# Patient Record
Sex: Male | Born: 1955 | Race: White | Hispanic: No | Marital: Married | Smoking: Never smoker
Health system: Southern US, Community
[De-identification: ages and names within clinical notes are randomized; demographics above are authoritative.]

## PROBLEM LIST (undated history)

## (undated) DIAGNOSIS — T79A22A Traumatic compartment syndrome of left lower extremity, initial encounter: Secondary | ICD-10-CM

## (undated) DIAGNOSIS — S82202B Unspecified fracture of shaft of left tibia, initial encounter for open fracture type I or II: Secondary | ICD-10-CM

## (undated) DIAGNOSIS — S82402B Unspecified fracture of shaft of left fibula, initial encounter for open fracture type I or II: Secondary | ICD-10-CM

---

## 2018-06-08 ENCOUNTER — Other Ambulatory Visit: Payer: Self-pay

## 2018-06-08 ENCOUNTER — Encounter (HOSPITAL_COMMUNITY): Payer: Self-pay | Admitting: Anesthesiology

## 2018-06-08 ENCOUNTER — Encounter (HOSPITAL_COMMUNITY)
Admission: EM | Disposition: A | Discharge status: Home / Self Care | Payer: Self-pay | Source: Home / Self Care | Attending: Orthopedic Surgery

## 2018-06-08 ENCOUNTER — Emergency Department (HOSPITAL_COMMUNITY): Payer: Self-pay

## 2018-06-08 ENCOUNTER — Emergency Department (HOSPITAL_COMMUNITY): Payer: Self-pay | Admitting: Anesthesiology

## 2018-06-08 ENCOUNTER — Inpatient Hospital Stay (HOSPITAL_COMMUNITY)
Admission: EM | Admit: 2018-06-08 | Discharge: 2018-06-13 | DRG: 493 | Disposition: A | Discharge status: Home / Self Care | Payer: Self-pay | Attending: Orthopedic Surgery | Admitting: Orthopedic Surgery

## 2018-06-08 DIAGNOSIS — Y9339 Activity, other involving climbing, rappelling and jumping off: Secondary | ICD-10-CM

## 2018-06-08 DIAGNOSIS — H7190 Unspecified cholesteatoma, unspecified ear: Secondary | ICD-10-CM | POA: Diagnosis present

## 2018-06-08 DIAGNOSIS — S82222B Displaced transverse fracture of shaft of left tibia, initial encounter for open fracture type I or II: Secondary | ICD-10-CM | POA: Diagnosis present

## 2018-06-08 DIAGNOSIS — Z419 Encounter for procedure for purposes other than remedying health state, unspecified: Secondary | ICD-10-CM

## 2018-06-08 DIAGNOSIS — Y93I9 Activity, other involving external motion: Secondary | ICD-10-CM

## 2018-06-08 DIAGNOSIS — W3089XA Contact with other specified agricultural machinery, initial encounter: Secondary | ICD-10-CM

## 2018-06-08 DIAGNOSIS — Y9279 Other farm location as the place of occurrence of the external cause: Secondary | ICD-10-CM

## 2018-06-08 DIAGNOSIS — S82402B Unspecified fracture of shaft of left fibula, initial encounter for open fracture type I or II: Secondary | ICD-10-CM

## 2018-06-08 DIAGNOSIS — I10 Essential (primary) hypertension: Secondary | ICD-10-CM | POA: Diagnosis present

## 2018-06-08 DIAGNOSIS — S82422A Displaced transverse fracture of shaft of left fibula, initial encounter for closed fracture: Secondary | ICD-10-CM

## 2018-06-08 DIAGNOSIS — S82222A Displaced transverse fracture of shaft of left tibia, initial encounter for closed fracture: Secondary | ICD-10-CM

## 2018-06-08 DIAGNOSIS — S82202B Unspecified fracture of shaft of left tibia, initial encounter for open fracture type I or II: Secondary | ICD-10-CM

## 2018-06-08 DIAGNOSIS — T79A22A Traumatic compartment syndrome of left lower extremity, initial encounter: Secondary | ICD-10-CM

## 2018-06-08 DIAGNOSIS — S82422B Displaced transverse fracture of shaft of left fibula, initial encounter for open fracture type I or II: Principal | ICD-10-CM | POA: Diagnosis present

## 2018-06-08 HISTORY — PX: I & D EXTREMITY: SHX5045

## 2018-06-08 HISTORY — PX: TIBIA IM NAIL INSERTION: SHX2516

## 2018-06-08 HISTORY — DX: Unspecified fracture of shaft of left tibia, initial encounter for open fracture type I or II: S82.202B

## 2018-06-08 HISTORY — DX: Unspecified fracture of shaft of left fibula, initial encounter for open fracture type I or II: S82.402B

## 2018-06-08 HISTORY — PX: FASCIOTOMY: SHX132

## 2018-06-08 HISTORY — DX: Traumatic compartment syndrome of left lower extremity, initial encounter: T79.A22A

## 2018-06-08 LAB — I-STAT CHEM 8, ED
BUN: 17 mg/dL (ref 6–20)
Calcium, Ion: 0.95 mmol/L — ABNORMAL LOW (ref 1.15–1.40)
Chloride: 108 mmol/L (ref 98–111)
Creatinine, Ser: 1.1 mg/dL (ref 0.61–1.24)
Glucose, Bld: 112 mg/dL — ABNORMAL HIGH (ref 70–99)
HCT: 38 % — ABNORMAL LOW (ref 39.0–52.0)
Hemoglobin: 12.9 g/dL — ABNORMAL LOW (ref 13.0–17.0)
POTASSIUM: 4 mmol/L (ref 3.5–5.1)
SODIUM: 140 mmol/L (ref 135–145)
TCO2: 21 mmol/L — ABNORMAL LOW (ref 22–32)

## 2018-06-08 LAB — CBC WITH DIFFERENTIAL/PLATELET
Abs Immature Granulocytes: 0.12 10*3/uL — ABNORMAL HIGH (ref 0.00–0.07)
BASOS ABS: 0.1 10*3/uL (ref 0.0–0.1)
Basophils Relative: 1 %
EOS ABS: 0.1 10*3/uL (ref 0.0–0.5)
EOS PCT: 1 %
HEMATOCRIT: 39.5 % (ref 39.0–52.0)
HEMOGLOBIN: 13.3 g/dL (ref 13.0–17.0)
Immature Granulocytes: 1 %
Lymphocytes Relative: 13 %
Lymphs Abs: 1.9 10*3/uL (ref 0.7–4.0)
MCH: 30.4 pg (ref 26.0–34.0)
MCHC: 33.7 g/dL (ref 30.0–36.0)
MCV: 90.4 fL (ref 80.0–100.0)
Monocytes Absolute: 0.8 10*3/uL (ref 0.1–1.0)
Monocytes Relative: 5 %
NRBC: 0 % (ref 0.0–0.2)
Neutro Abs: 12.4 10*3/uL — ABNORMAL HIGH (ref 1.7–7.7)
Neutrophils Relative %: 79 %
Platelets: 290 10*3/uL (ref 150–400)
RBC: 4.37 MIL/uL (ref 4.22–5.81)
RDW: 11.9 % (ref 11.5–15.5)
WBC: 15.4 10*3/uL — AB (ref 4.0–10.5)

## 2018-06-08 SURGERY — INSERTION, INTRAMEDULLARY ROD, TIBIA
Anesthesia: General | Site: Leg Lower | Laterality: Left

## 2018-06-08 MED ORDER — FENTANYL CITRATE (PF) 100 MCG/2ML IJ SOLN
100.0000 ug | Freq: Once | INTRAMUSCULAR | Status: AC
  Administered 2018-06-08: 100 ug via INTRAVENOUS

## 2018-06-08 MED ORDER — SUCCINYLCHOLINE CHLORIDE 20 MG/ML IJ SOLN
INTRAMUSCULAR | Status: DC | PRN
  Administered 2018-06-08: 140 mg via INTRAVENOUS

## 2018-06-08 MED ORDER — FENTANYL CITRATE (PF) 100 MCG/2ML IJ SOLN
INTRAMUSCULAR | Status: DC | PRN
  Administered 2018-06-08: 75 ug via INTRAVENOUS
  Administered 2018-06-08: 25 ug via INTRAVENOUS

## 2018-06-08 MED ORDER — PROPOFOL 10 MG/ML IV BOLUS
INTRAVENOUS | Status: DC | PRN
  Administered 2018-06-08: 150 mg via INTRAVENOUS

## 2018-06-08 MED ORDER — ASPIRIN EC 325 MG PO TBEC: 325.0000 mg | DELAYED_RELEASE_TABLET | Freq: Two times a day (BID) | ORAL | 0 refills | Status: AC

## 2018-06-08 MED ORDER — HYDROMORPHONE HCL 1 MG/ML IJ SOLN
1.0000 mg | Freq: Once | INTRAMUSCULAR | Status: AC
  Administered 2018-06-08: 1 mg via INTRAVENOUS

## 2018-06-08 MED ORDER — EPHEDRINE SULFATE 50 MG/ML IJ SOLN
INTRAMUSCULAR | Status: DC | PRN
  Administered 2018-06-08: 5 mg via INTRAVENOUS

## 2018-06-08 MED ORDER — HYDROMORPHONE HCL 1 MG/ML IJ SOLN
0.2500 mg | INTRAMUSCULAR | Status: DC | PRN
  Administered 2018-06-09: 0.5 mg via INTRAVENOUS

## 2018-06-08 MED ORDER — MIDAZOLAM HCL 5 MG/5ML IJ SOLN
INTRAMUSCULAR | Status: DC | PRN
  Administered 2018-06-08 (×2): 1 mg via INTRAVENOUS

## 2018-06-08 MED ORDER — LIDOCAINE 2% (20 MG/ML) 5 ML SYRINGE
INTRAMUSCULAR | Status: DC | PRN
  Administered 2018-06-08: 50 mg via INTRAVENOUS

## 2018-06-08 MED ORDER — HYDROMORPHONE HCL 1 MG/ML IJ SOLN
1.0000 mg | INTRAMUSCULAR | Status: DC | PRN
  Filled 2018-06-08 (×2): qty 1

## 2018-06-08 MED ORDER — SODIUM CHLORIDE 0.9 % IV SOLN
INTRAVENOUS | Status: DC | PRN
  Administered 2018-06-08: 100 ug/min via INTRAVENOUS

## 2018-06-08 MED ORDER — FENTANYL CITRATE (PF) 100 MCG/2ML IJ SOLN
INTRAMUSCULAR | Status: AC
  Filled 2018-06-08: qty 2

## 2018-06-08 MED ORDER — LIDOCAINE 2% (20 MG/ML) 5 ML SYRINGE
INTRAMUSCULAR | Status: AC
  Filled 2018-06-08: qty 5

## 2018-06-08 MED ORDER — SUCCINYLCHOLINE CHLORIDE 200 MG/10ML IV SOSY
PREFILLED_SYRINGE | INTRAVENOUS | Status: AC
  Filled 2018-06-08: qty 10

## 2018-06-08 MED ORDER — CEFAZOLIN SODIUM-DEXTROSE 1-4 GM/50ML-% IV SOLN
INTRAVENOUS | Status: DC | PRN
  Administered 2018-06-08: 2 g via INTRAVENOUS

## 2018-06-08 MED ORDER — DEXAMETHASONE SODIUM PHOSPHATE 10 MG/ML IJ SOLN
INTRAMUSCULAR | Status: DC | PRN
  Administered 2018-06-08: 5 mg via INTRAVENOUS

## 2018-06-08 MED ORDER — FENTANYL CITRATE (PF) 250 MCG/5ML IJ SOLN
INTRAMUSCULAR | Status: AC
  Filled 2018-06-08: qty 5

## 2018-06-08 MED ORDER — PROMETHAZINE HCL 25 MG/ML IJ SOLN: 6.2500 mg | INTRAMUSCULAR | Status: DC | PRN

## 2018-06-08 MED ORDER — ROCURONIUM BROMIDE 50 MG/5ML IV SOSY
PREFILLED_SYRINGE | INTRAVENOUS | Status: AC
  Filled 2018-06-08: qty 5

## 2018-06-08 MED ORDER — FENTANYL CITRATE (PF) 100 MCG/2ML IJ SOLN
100.0000 ug | Freq: Once | INTRAMUSCULAR | Status: AC
  Administered 2018-06-08: 100 ug via INTRAVENOUS
  Filled 2018-06-08: qty 2

## 2018-06-08 MED ORDER — CEPHALEXIN 500 MG PO CAPS: 500.0000 mg | ORAL_CAPSULE | Freq: Four times a day (QID) | ORAL | 0 refills | Status: AC

## 2018-06-08 MED ORDER — MIDAZOLAM HCL 2 MG/2ML IJ SOLN
INTRAMUSCULAR | Status: AC
  Filled 2018-06-08: qty 2

## 2018-06-08 MED ORDER — DEXAMETHASONE SODIUM PHOSPHATE 10 MG/ML IJ SOLN
INTRAMUSCULAR | Status: AC
  Filled 2018-06-08: qty 1

## 2018-06-08 MED ORDER — PHENYLEPHRINE 40 MCG/ML (10ML) SYRINGE FOR IV PUSH (FOR BLOOD PRESSURE SUPPORT)
PREFILLED_SYRINGE | INTRAVENOUS | Status: DC | PRN
  Administered 2018-06-08 (×2): 80 ug via INTRAVENOUS

## 2018-06-08 MED ORDER — OXYCODONE HCL 5 MG PO TABS: 5.0000 mg | ORAL_TABLET | ORAL | 0 refills | Status: AC | PRN

## 2018-06-08 MED ORDER — PHENYLEPHRINE 40 MCG/ML (10ML) SYRINGE FOR IV PUSH (FOR BLOOD PRESSURE SUPPORT)
PREFILLED_SYRINGE | INTRAVENOUS | Status: AC
  Filled 2018-06-08: qty 10

## 2018-06-08 MED ORDER — HYDROMORPHONE HCL 1 MG/ML IJ SOLN
0.5000 mg | Freq: Once | INTRAMUSCULAR | Status: DC
  Filled 2018-06-08: qty 1

## 2018-06-08 MED ORDER — CEFAZOLIN SODIUM-DEXTROSE 1-4 GM/50ML-% IV SOLN: 1.0000 g | Freq: Once | INTRAVENOUS | Status: DC

## 2018-06-08 MED ORDER — ONDANSETRON HCL 4 MG PO TABS: 4.0000 mg | ORAL_TABLET | Freq: Three times a day (TID) | ORAL | 0 refills | Status: AC | PRN

## 2018-06-08 MED ORDER — LACTATED RINGERS IV SOLN
INTRAVENOUS | Status: DC | PRN
  Administered 2018-06-08 (×2): via INTRAVENOUS

## 2018-06-08 MED ORDER — SODIUM CHLORIDE 0.9 % IR SOLN
Status: DC | PRN
  Administered 2018-06-08: 3000 mL
  Administered 2018-06-08: 6000 mL

## 2018-06-08 MED ORDER — SENNA-DOCUSATE SODIUM 8.6-50 MG PO TABS: 2.0000 | ORAL_TABLET | Freq: Every day | ORAL | 1 refills | Status: AC

## 2018-06-08 MED ORDER — ROCURONIUM BROMIDE 50 MG/5ML IV SOSY
PREFILLED_SYRINGE | INTRAVENOUS | Status: DC | PRN
  Administered 2018-06-08: 30 mg via INTRAVENOUS

## 2018-06-08 MED ORDER — MEPERIDINE HCL 50 MG/ML IJ SOLN: 6.2500 mg | INTRAMUSCULAR | Status: DC | PRN

## 2018-06-08 MED ORDER — ONDANSETRON HCL 4 MG/2ML IJ SOLN
INTRAMUSCULAR | Status: DC | PRN
  Administered 2018-06-08: 4 mg via INTRAVENOUS

## 2018-06-08 MED ORDER — 0.9 % SODIUM CHLORIDE (POUR BTL) OPTIME
TOPICAL | Status: DC | PRN
  Administered 2018-06-08: 1000 mL

## 2018-06-08 MED ORDER — ALBUMIN HUMAN 5 % IV SOLN
INTRAVENOUS | Status: DC | PRN
  Administered 2018-06-08: 23:00:00 via INTRAVENOUS

## 2018-06-08 MED ORDER — LACTATED RINGERS IV SOLN: INTRAVENOUS | Status: DC

## 2018-06-08 MED ORDER — ONDANSETRON HCL 4 MG/2ML IJ SOLN
INTRAMUSCULAR | Status: AC
  Filled 2018-06-08: qty 2

## 2018-06-08 SURGICAL SUPPLY — 60 items
BANDAGE ESMARK 6X9 LF (GAUZE/BANDAGES/DRESSINGS) ×2 IMPLANT
BIT DRILL 3.8X6 NS (BIT) ×4 IMPLANT
BIT DRILL 4.4 NS (BIT) ×4 IMPLANT
BLADE CLIPPER SURG (BLADE) ×4 IMPLANT
BNDG COHESIVE 6X5 TAN STRL LF (GAUZE/BANDAGES/DRESSINGS) ×4 IMPLANT
BNDG ELASTIC 6X10 VLCR STRL LF (GAUZE/BANDAGES/DRESSINGS) ×4 IMPLANT
BNDG ESMARK 6X9 LF (GAUZE/BANDAGES/DRESSINGS) ×4
BOOTCOVER CLEANROOM LRG (PROTECTIVE WEAR) ×8 IMPLANT
CANISTER WOUND CARE 500ML ATS (WOUND CARE) ×4 IMPLANT
CLOSURE WOUND 1/2 X4 (GAUZE/BANDAGES/DRESSINGS) ×1
CONNECTOR Y ATS VAC SYSTEM (MISCELLANEOUS) ×4 IMPLANT
COVER SURGICAL LIGHT HANDLE (MISCELLANEOUS) ×4 IMPLANT
CUFF TOURNIQUET SINGLE 34IN LL (TOURNIQUET CUFF) ×4 IMPLANT
DRAPE C-ARM 42X72 X-RAY (DRAPES) ×4 IMPLANT
DRAPE IMP U-DRAPE 54X76 (DRAPES) ×4 IMPLANT
DRAPE U-SHAPE 47X51 STRL (DRAPES) ×4 IMPLANT
DRSG MEPILEX BORDER 4X4 (GAUZE/BANDAGES/DRESSINGS) ×4 IMPLANT
DRSG VAC ATS LRG SENSATRAC (GAUZE/BANDAGES/DRESSINGS) ×4 IMPLANT
ELECT CAUTERY BLADE 6.4 (BLADE) ×4 IMPLANT
ELECT REM PT RETURN 9FT ADLT (ELECTROSURGICAL) ×4
ELECTRODE REM PT RTRN 9FT ADLT (ELECTROSURGICAL) ×2 IMPLANT
GLOVE BIOGEL PI IND STRL 7.0 (GLOVE) ×2 IMPLANT
GLOVE BIOGEL PI INDICATOR 7.0 (GLOVE) ×2
GLOVE BIOGEL PI ORTHO PRO SZ8 (GLOVE) ×2
GLOVE ORTHO TXT STRL SZ7.5 (GLOVE) ×12 IMPLANT
GLOVE PI ORTHO PRO STRL SZ8 (GLOVE) ×2 IMPLANT
GLOVE SURG ORTHO 8.0 STRL STRW (GLOVE) ×8 IMPLANT
GLOVE SURG SS PI 7.0 STRL IVOR (GLOVE) ×8 IMPLANT
GOWN STRL REUS W/ TWL LRG LVL3 (GOWN DISPOSABLE) ×6 IMPLANT
GOWN STRL REUS W/TWL LRG LVL3 (GOWN DISPOSABLE) ×6
GUIDEPIN 3.2X17.5 THRD DISP (PIN) ×4 IMPLANT
GUIDEWIRE BALL NOSE 80CM (WIRE) ×4 IMPLANT
KIT BASIN OR (CUSTOM PROCEDURE TRAY) ×4 IMPLANT
KIT TURNOVER KIT B (KITS) ×4 IMPLANT
MANIFOLD NEPTUNE II (INSTRUMENTS) ×4 IMPLANT
NAIL TIBIAL 9MMX34.5CM (Nail) ×4 IMPLANT
NS IRRIG 1000ML POUR BTL (IV SOLUTION) ×4 IMPLANT
PACK GENERAL/GYN (CUSTOM PROCEDURE TRAY) ×4 IMPLANT
PACK UNIVERSAL I (CUSTOM PROCEDURE TRAY) ×4 IMPLANT
PAD ARMBOARD 7.5X6 YLW CONV (MISCELLANEOUS) ×8 IMPLANT
PAD NEG PRESSURE SENSATRAC (MISCELLANEOUS) ×4 IMPLANT
SCREW ACECAP 36MM (Screw) ×4 IMPLANT
SCREW ACECAP 50MM (Screw) ×4 IMPLANT
SCREW PROXIMAL DEPUY (Screw) ×4 IMPLANT
SCREW PRXML FT 55X5.5XNS TIB (Screw) ×2 IMPLANT
SCREW PRXML FT 65X5.5XNS CORT (Screw) ×2 IMPLANT
STRIP CLOSURE SKIN 1/2X4 (GAUZE/BANDAGES/DRESSINGS) ×3 IMPLANT
SUT ETHILON 3 0 PS 1 (SUTURE) ×8 IMPLANT
SUT VIC AB 0 CT1 18XCR BRD 8 (SUTURE) IMPLANT
SUT VIC AB 0 CT1 27 (SUTURE) ×2
SUT VIC AB 0 CT1 27XBRD ANBCTR (SUTURE) ×2 IMPLANT
SUT VIC AB 0 CT1 8-18 (SUTURE)
SUT VIC AB 2-0 CT1 27 (SUTURE)
SUT VIC AB 2-0 CT1 TAPERPNT 27 (SUTURE) IMPLANT
SUT VIC AB 3-0 SH 8-18 (SUTURE) ×4 IMPLANT
TOWEL GREEN STERILE (TOWEL DISPOSABLE) ×4 IMPLANT
TRAY FOLEY MTR SLVR 16FR STAT (SET/KITS/TRAYS/PACK) ×4 IMPLANT
TUBE CONNECTING 12'X1/4 (SUCTIONS) ×1
TUBE CONNECTING 12X1/4 (SUCTIONS) ×3 IMPLANT
YANKAUER SUCT BULB TIP NO VENT (SUCTIONS) ×4 IMPLANT

## 2018-06-08 NOTE — Discharge Instructions (Signed)
Diet: As you were doing prior to hospitalization   Shower:  May shower but keep the wounds dry, use an occlusive plastic wrap, NO SOAKING IN TUB.  If the bandage gets wet, change with a clean dry gauze.  If you have a splint on, leave the splint in place and keep the splint dry with a plastic bag.  Dressing:  You may change your dressing 3-5 days after surgery, unless you have a splint.  If you have a splint, then just leave the splint in place and we will change your bandages during your first follow-up appointment.    If you had hand or foot surgery, we will plan to remove your stitches in about 2 weeks in the office.  For all other surgeries, there are sticky tapes (steri-strips) on your wounds and all the stitches are absorbable.  Leave the steri-strips in place when changing your dressings, they will peel off with time, usually 2-3 weeks.  Activity:  Increase activity slowly as tolerated, but follow the weight bearing instructions below.  The rules on driving is that you can not be taking narcotics while you drive, and you must feel in control of the vehicle.    Weight Bearing:   Non weight bearing left leg.    To prevent constipation: you may use a stool softener such as -  Colace (over the counter) 100 mg by mouth twice a day  Drink plenty of fluids (prune juice may be helpful) and high fiber foods Miralax (over the counter) for constipation as needed.    Itching:  If you experience itching with your medications, try taking only a single pain pill, or even half a pain pill at a time.  You may take up to 10 pain pills per day, and you can also use benadryl over the counter for itching or also to help with sleep.   Precautions:  If you experience chest pain or shortness of breath - call 911 immediately for transfer to the hospital emergency department!!  If you develop a fever greater that 101 F, purulent drainage from wound, increased redness or drainage from wound, or calf pain -- Call  the office at [phone]                                                Follow- Up Appointment:  Please call for an appointment to be seen in 2 weeks [place] - [phone]     

## 2018-06-08 NOTE — ED Notes (Signed)
Given Ancef 1 g en route to hospital

## 2018-06-08 NOTE — Op Note (Signed)
06/08/2018  10:20 PM  PATIENT:  Ross Carr    PRE-OPERATIVE DIAGNOSIS: Open left tibia and fibula fracture, grade 1 with compartment syndrome  POST-OPERATIVE DIAGNOSIS:  Same  PROCEDURE:    1.  Left tibia and fibula irrigation, debridement, skin, subcutaneous tissue, bone, using scissors, rongure, curette 2.  Left tibia intramedullary nail fixation 3.  Left leg 4 compartment fasciotomy 4.  Application of large wound VAC, to both medial and lateral sides of the leg  SURGEON:  Eulas Post, MD  PHYSICIAN ASSISTANT: Janace Litten, OPA-C, present and scrubbed throughout the case, critical for completion in a timely fashion, and for retraction, instrumentation, and closure.  ANESTHESIA:   General  PREOPERATIVE INDICATIONS:  Ross Carr is a  62 y.o. male who was crushed by a tractor and had an acute open left tibia and fibula fracture with inconsolable pain consistent with compartment syndrome who elected for surgical management.    The risks benefits and alternatives were discussed with the patient including but not limited to the risks of nonoperative treatment, versus surgical intervention including infection, bleeding, nerve injury, malunion, nonunion, the need for revision surgery, hardware prominence, hardware failure, the need for hardware removal, blood clots, cardiopulmonary complications, morbidity, mortality, among others, and they were willing to proceed.    ESTIMATED BLOOD LOSS: 250 mL  OPERATIVE IMPLANTS: Biomet tibial nail size 9 x 345 with a total of 1 proximal interlocking bolts, and 2 distal interlocking bolts.  OPERATIVE FINDINGS: His bone quality proximally was actually not nearly as good as I would have predicted.  He also had a patella [place] which made it somewhat difficult to get appropriate nail placement.  There was substantial comminution at the actual fracture site with a butterfly segment which keyed in nicely.  The posterior compartment was soft,  although did bulge out upon release, the deep posterior compartment had a fair amount of hemorrhage, and also released fairly significantly.  The really dramatic compartment was the anterior compartment, the lateral compartment was not as tight.  On the anterior compartment some of the anterior musculature was very limited in its contractility with Bovie stimulation, and there was significant hemorrhage.  OPERATIVE PROCEDURE: The patient was brought to the operating room and placed in the supine position. Gen. anesthesia was administered.  Foley was placed.  The lower extremity was prepped and draped in usual sterile fashion. Time out was performed.  I began with medial fasciotomy which gave me ample access for irrigation and debridement, the bone ends were exposed, the traumatic laceration was opened enough to deliver the bone ends completely, the wounds were debrided with a scissors as well as a rongure, and a total of 9 L of fluid was irrigated through both ends of the fracture.  I took care to try and minimize risk to the saphenous nerve, although I did not encounter it.  I then changed instruments, gloves, drapes, and proceeded with the lateral fasciotomy.  A 15 cm fasciotomy was made longitudinally halfway between the fibula and the tibia.  The perforating vessel through the fascia was identified, care was taken to protect the superficial peroneal nerve, and the fascia was released anteriorly and posteriorly both proximally and distally.  The anterior compartment bulged significantly, and was hemorrhagic, and had minimal contractility.  I then proceeded with the intramedullary nail fixation of the case.  Anterior patellar tendon incision was performed and the proximal tibia exposed. The knee was hyperflexed, and a guidewire introduced into the appropriate position and confirmed  on fluoroscopy on both AP and lateral views.  I opened the proximal tibia with the appropriate reamer, and then placed a  ball-tipped guidewire down across the fracture site reducing it anatomically.  I confirmed position on AP and lateral views across the entire length of the tibia, and then reamed sequentially to 1.5 mm above the nail size.  He had substantial chatter even at the size 9 nail.  The nail was measured in length, selected, opened, assembled, and then delivered down the tibia across the fracture site. Appropriate alignment confirmed on AP and lateral views. The length was also confirmed.  I could palpate the fracture directly and had anatomic rotational alignment, and the butterfly segment posterior medially keyed in perfectly.  I then secured the nail with proximal interlocking bolts, and also used perfect circles technique to secure the nail distally with interlocking bolts. I took care to confirm fracture apposition as well as rotational alignment clinically and radiographically prior to securing the distal segment.  Final C-arm pictures were taken, the wounds were irrigated copiously, there were also injected with Marcaine, and the patellar tendon split repaired with Vicryl followed by Vicryl and Monocryl for the subcutaneous tissues.   A wound VAC was applied both medially and laterally, and had excellent seal.  The patient was awakened and returned to the PACU in stable and satisfactory condition. There were no complications.  He will need to return to the operating room for repeat irrigation and debridement, and possible closure.  We did place retention sutures to try and minimize the creep of the tissue.  I also did perform a slight manipulation of the fibula to try and get better apposition of the bone, although I did not expose the fibula itself.

## 2018-06-08 NOTE — H&P (Addendum)
PREOPERATIVE H&P  Chief Complaint: Left leg pain  HPI: Ross Carr is a 62 y.o. male who was crushed by an 800 pound tractor today, which tipped over onto his left leg.  Acute severe pain, difficulty with moving it.  Pain is been getting excruciatingly worse, uncontrolled with IV pain medications.  He is noted significant swelling as well as persistent bleeding.  Denies any other injuries.  He has a complicated past history, in fact his wife had a tibial nail done before, his right upper extremity also had what sounds like a compartment syndrome with fasciotomies, he is fairly familiar with this whole process.  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  No past medical history on file.  Social History   Socioeconomic History  . Marital status: Married    Spouse name: Not on file  . Number of children: Not on file  . Years of education: Not on file  . Highest education level: Not on file  Occupational History  . Not on file  Social Needs  . Financial resource strain: Not on file  . Food insecurity:    Worry: Not on file    Inability: Not on file  . Transportation needs:    Medical: Not on file    Non-medical: Not on file  Tobacco Use  . Smoking status: Not on file  Substance and Sexual Activity  . Alcohol use: Not on file  . Drug use: Not on file  . Sexual activity: Not on file  Lifestyle  . Physical activity:    Days per week: Not on file    Minutes per session: Not on file  . Stress: Not on file  Relationships  . Social connections:    Talks on phone: Not on file    Gets together: Not on file    Attends religious service: Not on file    Active member of club or organization: Not on file    Attends meetings of clubs or organizations: Not on file    Relationship status: Not on file  Other Topics Concern  . Not on file   Social History Narrative  . Not on file   No family history on file. Not on File Prior to Admission medications   Not on File     Positive ROS: All other systems have been reviewed and were otherwise negative with the exception of those mentioned in the HPI and as above.  Physical Exam: General: Alert, moderate distress Cardiovascular: Bounding bilateral pedal pulses, left side has significant soft tissue swelling particularly over the lateral compartment Respiratory: No cyanosis, no use of accessory musculature GI: No organomegaly, abdomen is soft and non-tender Skin: Open wound overlying the fracture site, I did not expose this but there was significant bleeding. Neurologic: Sensation intact distally Psychiatric: Patient is competent for consent with normal mood and affect Lymphatic: No axillary or cervical lymphadenopathy  MUSCULOSKELETAL: Left leg has moderate deformity, sensation intact distally, EHL and FHL are intact, positive pain with both active and passive motion of the leg.  Assessment: Left tibia shaft fracture, open, likely grade 1 or possibly grade 2, with impending compartment syndrome   Plan: Plan for urgent surgical irrigation and debridement, intramedullary nailing, possible external fixation, with fasciotomy of 4 compartments.  The risks benefits and alternatives were discussed with the patient including  but not limited to the risks of nonoperative treatment, versus surgical intervention including infection, bleeding, nerve injury, malunion, nonunion, the need for revision surgery, hardware prominence, hardware failure, the need for hardware removal, blood clots, cardiopulmonary complications, morbidity, mortality, among others, and they were willing to proceed.    We have also discussed the fact that he will require additional surgical intervention to manage his fasciotomy wounds, including potential skin grafting.  He needs a dose of Ancef stat, and we will  also plan to proceed with surgery emergently despite his n.p.o. status.  He last ate breakfast early this morning, however had 3 beers, the last one was at 5 PM.  Eulas Post, MD Cell [phone]   06/08/2018 9:01 PM

## 2018-06-08 NOTE — ED Triage Notes (Signed)
Pt arrives via [facility], tractor rolled over and pt jumped off, while flipping over the tractor an approximately 800 lb attachment rolled over onto pt's leg, pt's brother removed PTA. Per EMS, FD noted bone sticking out of the back of the pt's leg.  Initial BP for EMS was 90/palp, pt received fluid bol;us bringing pressure up to 156/98 Pt received 166fentnyl en route, 4mg  zofran en route  HR sinus tach 102  C-spine cleared by EMS  Pt reports drinkning 3 beers approx 2 hours ago

## 2018-06-08 NOTE — Anesthesia Preprocedure Evaluation (Addendum)
Anesthesia Evaluation  Patient identified by MRN, date of birth, ID band Patient awake    Reviewed: Allergy & Precautions, NPO status , Patient's Chart, lab work & pertinent test results  Airway Mallampati: II  TM Distance: >3 FB Neck ROM: Full    Dental  (+) Edentulous Upper, Edentulous Lower, Dental Advisory Given   Pulmonary neg pulmonary ROS,    breath sounds clear to auscultation (-) decreased breath sounds      Cardiovascular hypertension, Pt. on medications  Rhythm:Regular Rate:Tachycardia     Neuro/Psych negative neurological ROS     GI/Hepatic negative GI ROS, Neg liver ROS,   Endo/Other  negative endocrine ROS  Renal/GU negative Renal ROS     Musculoskeletal negative musculoskeletal ROS (+)   Abdominal Normal abdominal exam  (+)   Peds  Hematology negative hematology ROS (+)   Anesthesia Other Findings   Reproductive/Obstetrics                           Anesthesia Physical Anesthesia Plan  ASA: II and emergent  Anesthesia Plan: General   Post-op Pain Management:    Induction: Intravenous, Rapid sequence and Cricoid pressure planned  PONV Risk Score and Plan: 3 and Ondansetron, Dexamethasone and Midazolam  Airway Management Planned: Oral ETT  Additional Equipment: None  Intra-op Plan:   Post-operative Plan: Extubation in OR  Informed Consent: I have reviewed the patients History and Physical, chart, labs and discussed the procedure including the risks, benefits and alternatives for the proposed anesthesia with the patient or authorized representative who has indicated his/her understanding and acceptance.     Plan Discussed with: CRNA  Anesthesia Plan Comments:        Anesthesia Quick Evaluation

## 2018-06-08 NOTE — Anesthesia Procedure Notes (Signed)
Procedure Name: Intubation Date/Time: 06/08/2018 10:12 PM Performed by: Edmonia Caprio, CRNA Pre-anesthesia Checklist: Patient identified, Emergency Drugs available, Suction available, Patient being monitored and Timeout performed Patient Re-evaluated:Patient Re-evaluated prior to induction Oxygen Delivery Method: Circle system utilized Induction Type: IV induction, Rapid sequence and Cricoid Pressure applied Laryngoscope Size: Miller and 2 Grade View: Grade I Tube type: Oral Tube size: 7.5 mm Number of attempts: 1 Airway Equipment and Method: Stylet Placement Confirmation: ETT inserted through vocal cords under direct vision,  positive ETCO2 and breath sounds checked- equal and bilateral Secured at: 22 cm Tube secured with: Tape Dental Injury: Teeth and Oropharynx as per pre-operative assessment

## 2018-06-08 NOTE — ED Provider Notes (Signed)
[facility] EMERGENCY DEPARTMENT Provider Note  CSN: [id number] Arrival date & time: 06/08/18  1858  History   Chief Complaint Chief Complaint  Patient presents with  . Leg Injury    HPI Ross Carr is a 62 y.o. male with no significant medical history who presented to the ED for left leg injury. 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 After coming off the tractor, he states an 800+lb attachment fell on to his left leg. Patient was moved from the implement by his brother. Endorses severe pain and deformity to the left lower leg. Denies paresthesias or color or temperature changes to the extremity. Patient received Fentanyl and Cefazolin by EMS en route to the ED.  Additional history obtained by EMS. There was concern for an open fracture and firefighters stated that they saw a bone sticking out of the posterior aspect of the patient's leg.  No past medical history on file.  There are no active problems to display for this patient.  Home Medications    Prior to Admission medications   Not on File    Family History No family history on file.  Social History Social History   Tobacco Use  . Smoking status: Not on file  Substance Use Topics  . Alcohol use: Not on file  . Drug use: Not on file     Allergies   Patient has no allergy information on record.   Review of Systems Review of Systems  Musculoskeletal: Positive for arthralgias and joint swelling.  Skin: Positive for wound.  Neurological: Negative for weakness and numbness.  Hematological: Does not bruise/bleed easily.  All other systems reviewed and are negative.  Physical Exam Updated Vital Signs BP (!) 141/105   Pulse 91   Temp 99.2 F (37.3 C) (Oral)   Resp 17   Ht 5\' 9"  (1.753 m)   Wt 77.1 kg   SpO2 98%   BMI 25.10 kg/m   Physical Exam  Constitutional: He appears well-developed and  well-nourished.  Patient shaking and yelling loudly in pain.  Cardiovascular: Normal rate, regular rhythm, normal heart sounds and intact distal pulses.  Pulses:      Dorsalis pedis pulses are 2+ on the right side, and 2+ on the left side.       Posterior tibial pulses are 2+ on the right side, and 2+ on the left side.  Pulmonary/Chest: Effort normal and breath sounds normal.  Musculoskeletal:       Left knee: Normal.       Left ankle: Normal. He exhibits no swelling. No tenderness.       Left lower leg: He exhibits tenderness, bony tenderness, swelling and deformity.       Right foot: There is normal range of motion and no deformity.       Left foot: There is normal range of motion and no deformity.  Neurological: No sensory deficit.  Skin: Capillary refill takes less than 2 seconds. Bruising noted.  Small laceration (~0.5cm) seen on anterior aspect of left lower leg with active bleeding. Blue bruising around the left lower leg. No other open wounds visualized. Bone not visible.  Nursing note and vitals reviewed.  ED Treatments / Results  Labs (all labs ordered are listed, but only abnormal results are displayed) Labs Reviewed  CBC WITH DIFFERENTIAL/PLATELET   EKG None  Radiology No results found.  Procedures Procedures (including critical care  time)  Medications Ordered in ED Medications  fentaNYL (SUBLIMAZE) 100 MCG/2ML injection (has no administration in time range)   Initial Impression / Assessment and Plan / ED Course  Triage vital signs and the nursing notes have been reviewed.  Pertinent labs & imaging results that were available during care of the patient were reviewed and considered in medical decision making (see chart for details).  Patient presents with obvious left lower leg deformity after an 800lb tractor implement landed on the patient. He was transported via EMS. EMS report states this is an open fracture. However, there was a small puncture wound seen on  the anterior aspect of the lower leg, but no other open wounds appreciated or visible muscle or bone seen. There is obvious deformity, swelling and bruising seen to the left tib-fib. Fortunately, he has intact DP and PT pulses with normal capillary refill, temperature and sensation. X-rays ordered prior to ortho consult.  Clinical Course as of Jun 08 2109  Sat Jun 08, 2018  1858 Per RN report, patient received Fentanyl and Cefazolin 1g in EMS en route.   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  He is complaining of severe pain.  He has intact sensation and pulses distal.  He is an obvious deformity in the mid tib-fib.  He will be getting some x-rays and orthopedic consult.  Antibiotics pain control tetanus update as needed.   [MB]  2015 X-ray results shows transverse tibia and fibula fractures. Consult placed to ortho for further evaluation and recommendation.   [GM]  2023 Case discussed with Dr. Dion Saucier who will come in and evaluate the patient in the ED.   [GM]  2058 Dr. Dion Saucier will take patient to the OR tonight for repair. PT/INR and chem-8 ordered.   [GM]    Clinical Course User Index [GM] Teandre Hamre, Sharyon Medicus, PA-C [MB] Terrilee Files, MD    Final Clinical Impressions(s) / ED Diagnoses  1. Left Transverse Tibia and Fibula Fractures. Case discussed with ortho, Dr. Dion Saucier, who will admit patient and perform repair in the OR.   Dispo: Admit. Patient will be taken to the OR for repair.  Final diagnoses:  Closed displaced transverse fracture of shaft of left tibia, initial encounter  Closed displaced transverse fracture of shaft of left fibula, initial encounter    ED Discharge Orders    None        Reva Bores 06/08/18 2110    Terrilee Files, MD 06/09/18 1210

## 2018-06-09 ENCOUNTER — Inpatient Hospital Stay (HOSPITAL_COMMUNITY): Payer: Self-pay

## 2018-06-09 LAB — COMPREHENSIVE METABOLIC PANEL
ALT: 26 U/L (ref 0–44)
AST: 44 U/L — ABNORMAL HIGH (ref 15–41)
Albumin: 3.8 g/dL (ref 3.5–5.0)
Alkaline Phosphatase: 34 U/L — ABNORMAL LOW (ref 38–126)
Anion gap: 10 (ref 5–15)
BILIRUBIN TOTAL: 0.7 mg/dL (ref 0.3–1.2)
BUN: 14 mg/dL (ref 6–20)
CHLORIDE: 106 mmol/L (ref 98–111)
CO2: 21 mmol/L — ABNORMAL LOW (ref 22–32)
CREATININE: 1.03 mg/dL (ref 0.61–1.24)
Calcium: 8 mg/dL — ABNORMAL LOW (ref 8.9–10.3)
GFR calc Af Amer: >60 mL/min (ref >60)
Glucose, Bld: 140 mg/dL — ABNORMAL HIGH (ref 70–99)
Potassium: 4 mmol/L (ref 3.5–5.1)
Sodium: 137 mmol/L (ref 135–145)
TOTAL PROTEIN: 6.5 g/dL (ref 6.5–8.1)

## 2018-06-09 LAB — PROTIME-INR
INR: 1.07
PROTHROMBIN TIME: 13.8 s (ref 11.4–15.2)

## 2018-06-09 MED ORDER — CHLORHEXIDINE GLUCONATE 4 % EX LIQD: 60.0000 mL | Freq: Once | CUTANEOUS | Status: DC

## 2018-06-09 MED ORDER — POLYETHYLENE GLYCOL 3350 17 G PO PACK
17.0000 g | PACK | Freq: Every day | ORAL | Status: DC | PRN
  Administered 2018-06-12: 17 g via ORAL
  Filled 2018-06-09: qty 1

## 2018-06-09 MED ORDER — ZOLPIDEM TARTRATE 5 MG PO TABS: 5.0000 mg | ORAL_TABLET | Freq: Every evening | ORAL | Status: DC | PRN

## 2018-06-09 MED ORDER — METHOCARBAMOL 500 MG PO TABS
ORAL_TABLET | ORAL | Status: AC
  Filled 2018-06-09: qty 1

## 2018-06-09 MED ORDER — ACETAMINOPHEN 500 MG PO TABS
1000.0000 mg | ORAL_TABLET | Freq: Four times a day (QID) | ORAL | Status: AC
  Administered 2018-06-09 – 2018-06-10 (×4): 1000 mg via ORAL
  Filled 2018-06-09 (×4): qty 2

## 2018-06-09 MED ORDER — DIPHENHYDRAMINE HCL 12.5 MG/5ML PO ELIX: 12.5000 mg | ORAL_SOLUTION | ORAL | Status: DC | PRN

## 2018-06-09 MED ORDER — HYDROMORPHONE HCL 1 MG/ML IJ SOLN
INTRAMUSCULAR | Status: AC
  Filled 2018-06-09: qty 1

## 2018-06-09 MED ORDER — OXYCODONE HCL 5 MG PO TABS
10.0000 mg | ORAL_TABLET | ORAL | Status: DC | PRN
  Administered 2018-06-09 – 2018-06-12 (×13): 15 mg via ORAL
  Administered 2018-06-13: 10 mg via ORAL
  Administered 2018-06-13: 15 mg via ORAL
  Filled 2018-06-09: qty 2
  Filled 2018-06-09 (×15): qty 3

## 2018-06-09 MED ORDER — ENOXAPARIN SODIUM 40 MG/0.4ML ~~LOC~~ SOLN
40.0000 mg | SUBCUTANEOUS | Status: DC
  Administered 2018-06-09 – 2018-06-13 (×4): 40 mg via SUBCUTANEOUS
  Filled 2018-06-09 (×4): qty 0.4

## 2018-06-09 MED ORDER — METOCLOPRAMIDE HCL 5 MG/ML IJ SOLN: 5.0000 mg | Freq: Three times a day (TID) | INTRAMUSCULAR | Status: DC | PRN

## 2018-06-09 MED ORDER — ACETAMINOPHEN 325 MG PO TABS: 325.0000 mg | ORAL_TABLET | Freq: Four times a day (QID) | ORAL | Status: DC | PRN

## 2018-06-09 MED ORDER — HYDROMORPHONE HCL 1 MG/ML IJ SOLN
0.5000 mg | INTRAMUSCULAR | Status: DC | PRN
  Administered 2018-06-09: 1 mg via INTRAVENOUS
  Administered 2018-06-09 (×2): 0.5 mg via INTRAVENOUS
  Administered 2018-06-09 – 2018-06-11 (×10): 1 mg via INTRAVENOUS
  Filled 2018-06-09 (×13): qty 1

## 2018-06-09 MED ORDER — METHOCARBAMOL 500 MG PO TABS
500.0000 mg | ORAL_TABLET | Freq: Four times a day (QID) | ORAL | Status: DC | PRN
  Administered 2018-06-09 – 2018-06-12 (×7): 500 mg via ORAL
  Filled 2018-06-09 (×8): qty 1

## 2018-06-09 MED ORDER — CEFAZOLIN SODIUM-DEXTROSE 2-4 GM/100ML-% IV SOLN: 2.0000 g | INTRAVENOUS | Status: AC

## 2018-06-09 MED ORDER — ONDANSETRON HCL 4 MG/2ML IJ SOLN: 4.0000 mg | Freq: Four times a day (QID) | INTRAMUSCULAR | Status: DC | PRN

## 2018-06-09 MED ORDER — BISACODYL 10 MG RE SUPP: 10.0000 mg | Freq: Every day | RECTAL | Status: DC | PRN

## 2018-06-09 MED ORDER — ONDANSETRON HCL 4 MG PO TABS: 4.0000 mg | ORAL_TABLET | Freq: Four times a day (QID) | ORAL | Status: DC | PRN

## 2018-06-09 MED ORDER — MAGNESIUM CITRATE PO SOLN
1.0000 | Freq: Once | ORAL | Status: AC | PRN
  Administered 2018-06-13: 1 via ORAL
  Filled 2018-06-09: qty 296

## 2018-06-09 MED ORDER — CEFAZOLIN SODIUM-DEXTROSE 2-4 GM/100ML-% IV SOLN
2.0000 g | Freq: Four times a day (QID) | INTRAVENOUS | Status: AC
  Administered 2018-06-09 – 2018-06-11 (×12): 2 g via INTRAVENOUS
  Filled 2018-06-09 (×12): qty 100

## 2018-06-09 MED ORDER — HYDROCHLOROTHIAZIDE 25 MG PO TABS
25.0000 mg | ORAL_TABLET | Freq: Every day | ORAL | Status: DC
  Administered 2018-06-09 – 2018-06-10 (×2): 25 mg via ORAL
  Filled 2018-06-09 (×2): qty 1

## 2018-06-09 MED ORDER — POVIDONE-IODINE 10 % EX SWAB: 2.0000 "application " | Freq: Once | CUTANEOUS | Status: DC

## 2018-06-09 MED ORDER — OXYCODONE HCL 5 MG PO TABS: 5.0000 mg | ORAL_TABLET | ORAL | Status: DC | PRN

## 2018-06-09 MED ORDER — METOCLOPRAMIDE HCL 5 MG PO TABS: 5.0000 mg | ORAL_TABLET | Freq: Three times a day (TID) | ORAL | Status: DC | PRN

## 2018-06-09 MED ORDER — METHOCARBAMOL 1000 MG/10ML IJ SOLN: 500.0000 mg | Freq: Four times a day (QID) | INTRAVENOUS | Status: DC | PRN

## 2018-06-09 MED ORDER — POTASSIUM CHLORIDE IN NACL 20-0.45 MEQ/L-% IV SOLN
INTRAVENOUS | Status: DC
  Administered 2018-06-09: 75 mL/h via INTRAVENOUS
  Administered 2018-06-09 – 2018-06-11 (×4): via INTRAVENOUS
  Filled 2018-06-09 (×4): qty 1000

## 2018-06-09 MED ORDER — GABAPENTIN 300 MG PO CAPS
300.0000 mg | ORAL_CAPSULE | Freq: Three times a day (TID) | ORAL | Status: DC
  Administered 2018-06-09 – 2018-06-13 (×11): 300 mg via ORAL
  Filled 2018-06-09 (×12): qty 1

## 2018-06-09 MED ORDER — SUGAMMADEX SODIUM 200 MG/2ML IV SOLN
INTRAVENOUS | Status: DC | PRN
  Administered 2018-06-09: 150 mg via INTRAVENOUS

## 2018-06-09 MED ORDER — DOCUSATE SODIUM 100 MG PO CAPS
100.0000 mg | ORAL_CAPSULE | Freq: Two times a day (BID) | ORAL | Status: DC
  Administered 2018-06-09 – 2018-06-13 (×8): 100 mg via ORAL
  Filled 2018-06-09 (×9): qty 1

## 2018-06-09 NOTE — Plan of Care (Signed)
  Problem: Clinical Measurements: Goal: Ability to maintain clinical measurements within normal limits will improve Outcome: Progressing Goal: Will remain free from infection Outcome: Progressing Goal: Cardiovascular complication will be avoided Outcome: Progressing   Problem: Activity: Goal: Risk for activity intolerance will decrease Outcome: Progressing   Problem: Coping: Goal: Level of anxiety will decrease Outcome: Progressing   Problem: Elimination: Goal: Will not experience complications related to bowel motility Outcome: Progressing Goal: Will not experience complications related to urinary retention Outcome: Progressing   Problem: Pain Managment: Goal: General experience of comfort will improve Outcome: Progressing   Problem: Safety: Goal: Ability to remain free from injury will improve Outcome: Progressing   Problem: Skin Integrity: Goal: Risk for impaired skin integrity will decrease Outcome: Progressing

## 2018-06-09 NOTE — Progress Notes (Addendum)
Patient ID: Ross Carr, male   DOB: 04/02/1956, 62 y.o.   MRN: [id number]     Subjective:  Patient reports pain as mild to moderate.  Patient in bed and in no acute distress.    Objective:   VITALS:   Vitals:   06/09/18 0135 06/09/18 0149 06/09/18 0219 06/09/18 0513  BP: (!) 148/102 (!) 147/108 (!) 158/107 (!) 135/101  Pulse: (!) 109 (!) 116 (!) 114 (!) 105  Resp: 10 14    Temp:  98.2 F (36.8 C) 98.2 F (36.8 C) 98.2 F (36.8 C)  TempSrc:   Oral Oral  SpO2: 96% 97% 99% 98%  Weight:      Height:        ABD soft Sensation intact distally Dorsiflexion/Plantar flexion intact Incision: dressing C/D/I and moderate drainage Wound vac in place  Lab Results  Component Value Date   WBC 15.4 (H) 06/08/2018   HGB 12.9 (L) 06/08/2018   HCT 38.0 (L) 06/08/2018   MCV 90.4 06/08/2018   PLT 290 06/08/2018   BMET    Component Value Date/Time   NA 137 06/09/2018 0332   K 4.0 06/09/2018 0332   CL 106 06/09/2018 0332   CO2 21 (L) 06/09/2018 0332   GLUCOSE 140 (H) 06/09/2018 0332   BUN 14 06/09/2018 0332   CREATININE 1.03 06/09/2018 0332   CALCIUM 8.0 (L) 06/09/2018 0332   GFRNONAA >60 06/09/2018 0332   GFRAA >60 06/09/2018 0332     Assessment/Plan: 1 Day Post-Op   Principal Problem:   Open fracture of left tibia and fibula, type I or II, initial encounter Active Problems:   Compartment syndrome of left lower extremity (HCC)   Advance diet Up with therapy Plan for OR on Tuesday NWB left tibia Continue Wound vac    DOUGLAS PARRY, BRANDON 06/09/2018, 12:10 PM  Discussed and agree with above.  Will need delayed primary closure next week.   Teryl Lucy, MD Cell [phone]

## 2018-06-09 NOTE — Anesthesia Postprocedure Evaluation (Signed)
Anesthesia Post Note  Patient: Ross Carr  Procedure(s) Performed: INTRAMEDULLARY (IM) NAIL TIBIAL (Left ) FASCIOTOMY (Left Leg Lower) IRRIGATION AND DEBRIDEMENT EXTREMITY (Left Leg Lower)     Patient location during evaluation: PACU Anesthesia Type: General Level of consciousness: awake and alert Pain management: pain level controlled Vital Signs Assessment: post-procedure vital signs reviewed and stable Respiratory status: spontaneous breathing, nonlabored ventilation, respiratory function stable and patient connected to nasal cannula oxygen Cardiovascular status: blood pressure returned to baseline and stable Postop Assessment: no apparent nausea or vomiting Anesthetic complications: no    Last Vitals:  Vitals:   06/09/18 0135 06/09/18 0149  BP: (!) 148/102 (!) 147/108  Pulse: (!) 109 (!) 116  Resp: 10 14  Temp:  36.8 C  SpO2: 96% 97%    Last Pain:  Vitals:   06/09/18 0135  TempSrc:   PainSc: Asleep                 Shelton Silvas

## 2018-06-09 NOTE — Progress Notes (Addendum)
Patient ID: Ross Carr, male   DOB: 16-Jun-1956, 62 y.o.   MRN: [id number]     Subjective:  Patient reports pain as mild to moderate patient in the bed and in no acute distress.  Objective:   VITALS:   Vitals:   06/09/18 0135 06/09/18 0149 06/09/18 0219 06/09/18 0513  BP: (!) 148/102 (!) 147/108 (!) 158/107 (!) 135/101  Pulse: (!) 109 (!) 116 (!) 114 (!) 105  Resp: 10 14    Temp:  98.2 F (36.8 C) 98.2 F (36.8 C) 98.2 F (36.8 C)  TempSrc:   Oral Oral  SpO2: 96% 97% 99% 98%  Weight:      Height:        ABD soft Sensation intact distally Dorsiflexion/Plantar flexion intact Incision: dressing C/D/I and moderate drainage Wound vac in place No pain with passive motion and patient can actively flex and ext. His toes   Lab Results  Component Value Date   WBC 15.4 (H) 06/08/2018   HGB 12.9 (L) 06/08/2018   HCT 38.0 (L) 06/08/2018   MCV 90.4 06/08/2018   PLT 290 06/08/2018   BMET    Component Value Date/Time   NA 137 06/09/2018 0332   K 4.0 06/09/2018 0332   CL 106 06/09/2018 0332   CO2 21 (L) 06/09/2018 0332   GLUCOSE 140 (H) 06/09/2018 0332   BUN 14 06/09/2018 0332   CREATININE 1.03 06/09/2018 0332   CALCIUM 8.0 (L) 06/09/2018 0332   GFRNONAA >60 06/09/2018 0332   GFRAA >60 06/09/2018 0332     Assessment/Plan: 1 Day Post-Op   Principal Problem:   Open fracture of left tibia and fibula, type I or II, initial encounter Active Problems:   Compartment syndrome of left lower extremity (HCC)   Advance diet Up with therapy NWB left lower ext Plan for OR on Tuesday Continue Wound vac    DOUGLAS PARRY, BRANDON 06/09/2018, 11:49 AM  Discussed and agree with above. Will need delayed primary closure next week.    Teryl Lucy, MD Cell [phone]

## 2018-06-09 NOTE — Addendum Note (Signed)
Addendum  created 06/09/18 0554 by Edmonia Caprio, CRNA   Intraprocedure Flowsheets edited

## 2018-06-09 NOTE — Transfer of Care (Signed)
Immediate Anesthesia Transfer of Care Note  Patient: Ross Carr  Procedure(s) Performed: INTRAMEDULLARY (IM) NAIL TIBIAL (Left ) FASCIOTOMY (Left Leg Lower) IRRIGATION AND DEBRIDEMENT EXTREMITY (Left Leg Lower)  Patient Location: PACU  Anesthesia Type:General  Level of Consciousness: awake  Airway & Oxygen Therapy: Patient Spontanous Breathing and Patient connected to nasal cannula oxygen  Post-op Assessment: Report given to RN and Post -op Vital signs reviewed and stable  Post vital signs: Reviewed and stable  Last Vitals:  Vitals Value Taken Time  BP 128/88 06/09/2018  1:04 AM  Temp    Pulse 112 06/09/2018  1:07 AM  Resp 11 06/09/2018  1:07 AM  SpO2 95 % 06/09/2018  1:07 AM  Vitals shown include unvalidated device data.  Last Pain:  Vitals:   06/08/18 2014  TempSrc:   PainSc: 10-Worst pain ever         Complications: No apparent anesthesia complications
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
## 2018-06-10 ENCOUNTER — Encounter (HOSPITAL_COMMUNITY): Payer: Self-pay

## 2018-06-10 LAB — COMPREHENSIVE METABOLIC PANEL
ALBUMIN: 3.5 g/dL (ref 3.5–5.0)
ALT: 26 U/L (ref 0–44)
ANION GAP: 10 (ref 5–15)
AST: 62 U/L — ABNORMAL HIGH (ref 15–41)
Alkaline Phosphatase: 32 U/L — ABNORMAL LOW (ref 38–126)
BUN: 10 mg/dL (ref 6–20)
CHLORIDE: 98 mmol/L (ref 98–111)
CO2: 27 mmol/L (ref 22–32)
Calcium: 8.2 mg/dL — ABNORMAL LOW (ref 8.9–10.3)
Creatinine, Ser: 1.11 mg/dL (ref 0.61–1.24)
GFR calc non Af Amer: >60 mL/min (ref >60)
Glucose, Bld: 122 mg/dL — ABNORMAL HIGH (ref 70–99)
Potassium: 3.3 mmol/L — ABNORMAL LOW (ref 3.5–5.1)
SODIUM: 135 mmol/L (ref 135–145)
Total Bilirubin: 0.4 mg/dL (ref 0.3–1.2)
Total Protein: 5.8 g/dL — ABNORMAL LOW (ref 6.5–8.1)

## 2018-06-10 MED ORDER — CHLORHEXIDINE GLUCONATE 4 % EX LIQD: 60.0000 mL | Freq: Once | CUTANEOUS | Status: DC

## 2018-06-10 MED ORDER — POVIDONE-IODINE 10 % EX SWAB: 2.0000 "application " | Freq: Once | CUTANEOUS | Status: DC

## 2018-06-10 NOTE — Anesthesia Preprocedure Evaluation (Addendum)
Anesthesia Evaluation  Patient identified by MRN, date of birth, ID band Patient awake    Reviewed: Allergy & Precautions, NPO status , Patient's Chart, lab work & pertinent test results  Airway Mallampati: II  TM Distance: >3 FB Neck ROM: Full    Dental  (+) Edentulous Upper, Edentulous Lower, Dental Advisory Given   Pulmonary neg pulmonary ROS,    breath sounds clear to auscultation (-) decreased breath sounds      Cardiovascular hypertension, Pt. on medications  Rhythm:Regular Rate:Tachycardia     Neuro/Psych negative neurological ROS     GI/Hepatic negative GI ROS, Neg liver ROS,   Endo/Other  negative endocrine ROS  Renal/GU negative Renal ROS     Musculoskeletal negative musculoskeletal ROS (+)   Abdominal Normal abdominal exam  (+)   Peds  Hematology negative hematology ROS (+)   Anesthesia Other Findings   Reproductive/Obstetrics                             Anesthesia Physical  Anesthesia Plan  ASA: II  Anesthesia Plan: General   Post-op Pain Management:    Induction: Intravenous  PONV Risk Score and Plan: 3 and Ondansetron, Dexamethasone, Midazolam and Treatment may vary due to age or medical condition  Airway Management Planned: Oral ETT  Additional Equipment: None  Intra-op Plan:   Post-operative Plan: Extubation in OR  Informed Consent: I have reviewed the patients History and Physical, chart, labs and discussed the procedure including the risks, benefits and alternatives for the proposed anesthesia with the patient or authorized representative who has indicated his/her understanding and acceptance.     Plan Discussed with: CRNA, Anesthesiologist and Surgeon  Anesthesia Plan Comments: ( )       Anesthesia Quick Evaluation

## 2018-06-10 NOTE — Progress Notes (Signed)
Discussed with patient.   Plan for wound vac change and possible closure tomorrow with Dr. Carola Frost.   Eulas Post, MD

## 2018-06-10 NOTE — Progress Notes (Signed)
PT Cancellation Note  Patient Details Name: Ross Carr MRN: [id number] DOB: [birth year]   Cancelled Treatment:    Reason Eval/Treat Not Completed: Patient declined, no reason specified Patient sleeping deeply, reports he had some medicine that "just made me really out of it right now", politely declines PT this morning. Will attempt to return later if time/schedule allow, otherwise will attempt on next day of service if able.    Nedra Hai PT, DPT, CBIS  Supplemental Physical Therapist [facility]    Pager [phone] Acute Rehab Office [phone]

## 2018-06-11 ENCOUNTER — Inpatient Hospital Stay (HOSPITAL_COMMUNITY): Payer: Self-pay | Admitting: Anesthesiology

## 2018-06-11 ENCOUNTER — Encounter (HOSPITAL_COMMUNITY)
Admission: EM | Disposition: A | Discharge status: Home / Self Care | Payer: Self-pay | Source: Home / Self Care | Attending: Orthopedic Surgery

## 2018-06-11 ENCOUNTER — Encounter (HOSPITAL_COMMUNITY): Payer: Self-pay | Admitting: Anesthesiology

## 2018-06-11 HISTORY — PX: SECONDARY CLOSURE OF WOUND: SHX6208

## 2018-06-11 LAB — COMPREHENSIVE METABOLIC PANEL
ALK PHOS: 33 U/L — AB (ref 38–126)
ALT: 23 U/L (ref 0–44)
ANION GAP: 8 (ref 5–15)
AST: 59 U/L — ABNORMAL HIGH (ref 15–41)
Albumin: 3.3 g/dL — ABNORMAL LOW (ref 3.5–5.0)
BUN: 8 mg/dL (ref 6–20)
CALCIUM: 8.2 mg/dL — AB (ref 8.9–10.3)
CO2: 29 mmol/L (ref 22–32)
CREATININE: 0.91 mg/dL (ref 0.61–1.24)
Chloride: 96 mmol/L — ABNORMAL LOW (ref 98–111)
GFR calc non Af Amer: >60 mL/min (ref >60)
GLUCOSE: 115 mg/dL — AB (ref 70–99)
Potassium: 3.3 mmol/L — ABNORMAL LOW (ref 3.5–5.1)
Sodium: 133 mmol/L — ABNORMAL LOW (ref 135–145)
TOTAL PROTEIN: 6.1 g/dL — AB (ref 6.5–8.1)
Total Bilirubin: 0.6 mg/dL (ref 0.3–1.2)

## 2018-06-11 LAB — MAGNESIUM: Magnesium: 2.1 mg/dL (ref 1.7–2.4)

## 2018-06-11 LAB — MRSA PCR SCREENING: MRSA by PCR: NEGATIVE

## 2018-06-11 SURGERY — SECONDARY CLOSURE OF WOUND
Anesthesia: General | Site: Leg Lower | Laterality: Left

## 2018-06-11 MED ORDER — FENTANYL CITRATE (PF) 100 MCG/2ML IJ SOLN
INTRAMUSCULAR | Status: AC
  Filled 2018-06-11: qty 2

## 2018-06-11 MED ORDER — ONDANSETRON HCL 4 MG/2ML IJ SOLN
INTRAMUSCULAR | Status: AC
  Filled 2018-06-11: qty 2

## 2018-06-11 MED ORDER — OXYCODONE HCL 5 MG/5ML PO SOLN: 5.0000 mg | Freq: Once | ORAL | Status: DC | PRN

## 2018-06-11 MED ORDER — ACETAMINOPHEN 160 MG/5ML PO SOLN: 325.0000 mg | ORAL | Status: DC | PRN

## 2018-06-11 MED ORDER — SUGAMMADEX SODIUM 200 MG/2ML IV SOLN
INTRAVENOUS | Status: DC | PRN
  Administered 2018-06-11: 154.2 mg via INTRAVENOUS

## 2018-06-11 MED ORDER — ACETAMINOPHEN 325 MG PO TABS: 325.0000 mg | ORAL_TABLET | ORAL | Status: DC | PRN

## 2018-06-11 MED ORDER — FENTANYL CITRATE (PF) 100 MCG/2ML IJ SOLN
25.0000 ug | INTRAMUSCULAR | Status: DC | PRN
  Administered 2018-06-11 (×2): 25 ug via INTRAVENOUS

## 2018-06-11 MED ORDER — MEPERIDINE HCL 50 MG/ML IJ SOLN: 6.2500 mg | INTRAMUSCULAR | Status: DC | PRN

## 2018-06-11 MED ORDER — ROCURONIUM BROMIDE 50 MG/5ML IV SOSY
PREFILLED_SYRINGE | INTRAVENOUS | Status: DC | PRN
  Administered 2018-06-11: 30 mg via INTRAVENOUS

## 2018-06-11 MED ORDER — PANTOPRAZOLE SODIUM 40 MG PO TBEC
40.0000 mg | DELAYED_RELEASE_TABLET | Freq: Every day | ORAL | Status: DC
  Administered 2018-06-12 – 2018-06-13 (×2): 40 mg via ORAL
  Filled 2018-06-11 (×2): qty 1

## 2018-06-11 MED ORDER — 0.9 % SODIUM CHLORIDE (POUR BTL) OPTIME
TOPICAL | Status: DC | PRN
  Administered 2018-06-11: 1000 mL

## 2018-06-11 MED ORDER — SODIUM CHLORIDE 0.9 % IV SOLN
INTRAVENOUS | Status: DC | PRN
  Administered 2018-06-11: 50 ug/min via INTRAVENOUS

## 2018-06-11 MED ORDER — MIDAZOLAM HCL 2 MG/2ML IJ SOLN
INTRAMUSCULAR | Status: AC
  Filled 2018-06-11: qty 2

## 2018-06-11 MED ORDER — LIDOCAINE 2% (20 MG/ML) 5 ML SYRINGE
INTRAMUSCULAR | Status: DC | PRN
  Administered 2018-06-11: 80 mg via INTRAVENOUS

## 2018-06-11 MED ORDER — ONDANSETRON HCL 4 MG/2ML IJ SOLN: 4.0000 mg | Freq: Once | INTRAMUSCULAR | Status: DC | PRN

## 2018-06-11 MED ORDER — OXYCODONE HCL 5 MG PO TABS: 5.0000 mg | ORAL_TABLET | Freq: Once | ORAL | Status: DC | PRN

## 2018-06-11 MED ORDER — ONDANSETRON HCL 4 MG/2ML IJ SOLN
INTRAMUSCULAR | Status: DC | PRN
  Administered 2018-06-11: 4 mg via INTRAVENOUS

## 2018-06-11 MED ORDER — DEXAMETHASONE SODIUM PHOSPHATE 10 MG/ML IJ SOLN
INTRAMUSCULAR | Status: AC
  Filled 2018-06-11: qty 1

## 2018-06-11 MED ORDER — LACTATED RINGERS IV SOLN
INTRAVENOUS | Status: DC
  Administered 2018-06-11 (×2): via INTRAVENOUS

## 2018-06-11 MED ORDER — SUCCINYLCHOLINE CHLORIDE 200 MG/10ML IV SOSY
PREFILLED_SYRINGE | INTRAVENOUS | Status: DC | PRN
  Administered 2018-06-11: 120 mg via INTRAVENOUS

## 2018-06-11 MED ORDER — DEXAMETHASONE SODIUM PHOSPHATE 10 MG/ML IJ SOLN
INTRAMUSCULAR | Status: DC | PRN
  Administered 2018-06-11: 10 mg via INTRAVENOUS

## 2018-06-11 MED ORDER — ALBUMIN HUMAN 5 % IV SOLN
INTRAVENOUS | Status: DC | PRN
  Administered 2018-06-11: 08:00:00 via INTRAVENOUS

## 2018-06-11 MED ORDER — PROPOFOL 10 MG/ML IV BOLUS
INTRAVENOUS | Status: DC | PRN
  Administered 2018-06-11: 120 mg via INTRAVENOUS

## 2018-06-11 MED ORDER — MIDAZOLAM HCL 2 MG/2ML IJ SOLN
INTRAMUSCULAR | Status: DC | PRN
  Administered 2018-06-11: 2 mg via INTRAVENOUS
  Administered 2018-06-11: 50 mg via INTRAVENOUS

## 2018-06-11 MED ORDER — PROPOFOL 10 MG/ML IV BOLUS
INTRAVENOUS | Status: AC
  Filled 2018-06-11: qty 20

## 2018-06-11 MED ORDER — SODIUM CHLORIDE 0.9 % IR SOLN
Status: DC | PRN
  Administered 2018-06-11 (×2): 3000 mL

## 2018-06-11 MED ORDER — POTASSIUM CHLORIDE IN NACL 20-0.9 MEQ/L-% IV SOLN
INTRAVENOUS | Status: DC
  Administered 2018-06-11: 13:00:00 via INTRAVENOUS
  Filled 2018-06-11 (×2): qty 1000

## 2018-06-11 MED ORDER — PHENYLEPHRINE 40 MCG/ML (10ML) SYRINGE FOR IV PUSH (FOR BLOOD PRESSURE SUPPORT)
PREFILLED_SYRINGE | INTRAVENOUS | Status: DC | PRN
  Administered 2018-06-11 (×5): 160 ug via INTRAVENOUS

## 2018-06-11 MED ORDER — FENTANYL CITRATE (PF) 250 MCG/5ML IJ SOLN
INTRAMUSCULAR | Status: AC
  Filled 2018-06-11: qty 5

## 2018-06-11 MED ORDER — LIDOCAINE 2% (20 MG/ML) 5 ML SYRINGE
INTRAMUSCULAR | Status: AC
  Filled 2018-06-11: qty 5

## 2018-06-11 SURGICAL SUPPLY — 49 items
BLADE SURG 10 STRL SS (BLADE) ×3 IMPLANT
BNDG COHESIVE 4X5 TAN STRL (GAUZE/BANDAGES/DRESSINGS) ×3 IMPLANT
BNDG ELASTIC 6X10 VLCR STRL LF (GAUZE/BANDAGES/DRESSINGS) ×3 IMPLANT
BNDG GAUZE ELAST 4 BULKY (GAUZE/BANDAGES/DRESSINGS) ×3 IMPLANT
BNDG GAUZE STRTCH 6 (GAUZE/BANDAGES/DRESSINGS) ×9 IMPLANT
BRUSH SCRUB SURG 4.25 DISP (MISCELLANEOUS) ×6 IMPLANT
CLOSURE STERI-STRIP 1/2X4 (GAUZE/BANDAGES/DRESSINGS) ×2
CLSR STERI-STRIP ANTIMIC 1/2X4 (GAUZE/BANDAGES/DRESSINGS) ×4 IMPLANT
COVER SURGICAL LIGHT HANDLE (MISCELLANEOUS) ×3 IMPLANT
COVER WAND RF STERILE (DRAPES) ×3 IMPLANT
DRAPE C-ARMOR (DRAPES) ×3 IMPLANT
DRAPE U-SHAPE 47X51 STRL (DRAPES) ×3 IMPLANT
DRSG ADAPTIC 3X8 NADH LF (GAUZE/BANDAGES/DRESSINGS) ×3 IMPLANT
DRSG MEPITEL 4X7.2 (GAUZE/BANDAGES/DRESSINGS) ×6 IMPLANT
ELECT CAUTERY BLADE 6.4 (BLADE) IMPLANT
ELECT REM PT RETURN 9FT ADLT (ELECTROSURGICAL)
ELECTRODE REM PT RTRN 9FT ADLT (ELECTROSURGICAL) IMPLANT
GAUZE SPONGE 4X4 12PLY STRL (GAUZE/BANDAGES/DRESSINGS) ×6 IMPLANT
GLOVE BIO SURGEON STRL SZ7.5 (GLOVE) ×3 IMPLANT
GLOVE BIO SURGEON STRL SZ8 (GLOVE) ×3 IMPLANT
GLOVE BIOGEL PI IND STRL 7.5 (GLOVE) ×1 IMPLANT
GLOVE BIOGEL PI IND STRL 8 (GLOVE) ×1 IMPLANT
GLOVE BIOGEL PI INDICATOR 7.5 (GLOVE) ×2
GLOVE BIOGEL PI INDICATOR 8 (GLOVE) ×2
GOWN STRL REUS W/ TWL LRG LVL3 (GOWN DISPOSABLE) ×2 IMPLANT
GOWN STRL REUS W/ TWL XL LVL3 (GOWN DISPOSABLE) ×1 IMPLANT
GOWN STRL REUS W/TWL LRG LVL3 (GOWN DISPOSABLE) ×4
GOWN STRL REUS W/TWL XL LVL3 (GOWN DISPOSABLE) ×2
HANDPIECE INTERPULSE COAX TIP (DISPOSABLE)
KIT BASIN OR (CUSTOM PROCEDURE TRAY) ×3 IMPLANT
KIT TURNOVER KIT B (KITS) ×3 IMPLANT
MANIFOLD NEPTUNE II (INSTRUMENTS) ×3 IMPLANT
NS IRRIG 1000ML POUR BTL (IV SOLUTION) ×3 IMPLANT
PACK ORTHO EXTREMITY (CUSTOM PROCEDURE TRAY) ×3 IMPLANT
PAD ARMBOARD 7.5X6 YLW CONV (MISCELLANEOUS) ×6 IMPLANT
PADDING CAST COTTON 6X4 STRL (CAST SUPPLIES) ×3 IMPLANT
SET HNDPC FAN SPRY TIP SCT (DISPOSABLE) IMPLANT
SPONGE LAP 18X18 X RAY DECT (DISPOSABLE) ×3 IMPLANT
STOCKINETTE IMPERVIOUS 9X36 MD (GAUZE/BANDAGES/DRESSINGS) IMPLANT
SUT ETHILON 1 TP 1 60 (SUTURE) ×6 IMPLANT
SUT ETHILON 2 0 PSLX (SUTURE) ×12 IMPLANT
SUT PDS AB 2-0 CT1 27 (SUTURE) IMPLANT
SUT VIC AB 2-0 CT1 27 (SUTURE) ×6
SUT VIC AB 2-0 CT1 TAPERPNT 27 (SUTURE) ×3 IMPLANT
TOWEL OR 17X26 10 PK STRL BLUE (TOWEL DISPOSABLE) ×3 IMPLANT
TUBE CONNECTING 12'X1/4 (SUCTIONS) ×1
TUBE CONNECTING 12X1/4 (SUCTIONS) ×2 IMPLANT
UNDERPAD 30X30 (UNDERPADS AND DIAPERS) ×3 IMPLANT
YANKAUER SUCT BULB TIP NO VENT (SUCTIONS) ×3 IMPLANT

## 2018-06-11 NOTE — Plan of Care (Signed)
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
## 2018-06-11 NOTE — Anesthesia Procedure Notes (Signed)
Procedure Name: Intubation Date/Time: 06/11/2018 8:15 AM Performed by: Adria Dill, CRNA Pre-anesthesia Checklist: Patient identified, Emergency Drugs available, Suction available and Patient being monitored Patient Re-evaluated:Patient Re-evaluated prior to induction Oxygen Delivery Method: Circle system utilized Preoxygenation: Pre-oxygenation with 100% oxygen Induction Type: IV induction, Rapid sequence and Cricoid Pressure applied Laryngoscope Size: Miller and 3 Grade View: Grade I Tube type: Oral Tube size: 7.5 mm Number of attempts: 1 Airway Equipment and Method: Stylet Placement Confirmation: ETT inserted through vocal cords under direct vision,  positive ETCO2,  CO2 detector and breath sounds checked- equal and bilateral Secured at: 21 cm Tube secured with: Tape Dental Injury: Teeth and Oropharynx as per pre-operative assessment

## 2018-06-11 NOTE — Progress Notes (Signed)
PT Cancellation Note  Patient Details Name: Ross Carr MRN: [id number] DOB: March 20, 1956   Cancelled Treatment:    Reason Eval/Treat Not Completed: Patient at procedure or test/unavailable   [name] B [name] 06/11/2018, 7:08 AM  Delaney Meigs, PT Acute Rehabilitation Services Pager: [phone] Office: [phone]

## 2018-06-11 NOTE — Progress Notes (Signed)
I discussed with the patient the risks and benefits of surgery for attempted fasciotomy closure of left leg, including the possibility of infection, nerve injury, vessel injury, wound breakdown, arthritis, DVT/ PE, loss of motion, failure of closure, and need for further surgery among others.  He acknowledged these risks and wished to proceed.  Myrene Galas, MD [facility], [facility] [phone]

## 2018-06-11 NOTE — Progress Notes (Signed)
RN verified the presence of a signed informed consent that matches stated procedure by patient. Verified armband matches patient's stated name and birth date. Verified NPO status (0000) and that all jewelry, contact, glasses, dentures, and partials had been removed (if applicable).  

## 2018-06-11 NOTE — Anesthesia Postprocedure Evaluation (Signed)
Anesthesia Post Note  Patient: Ross Carr  Procedure(s) Performed: LEG WOUND VAC CHANGE VS CLOSURE OF WOUND (Left Leg Lower)     Patient location during evaluation: PACU Anesthesia Type: General Level of consciousness: awake and alert Pain management: pain level controlled Vital Signs Assessment: post-procedure vital signs reviewed and stable Respiratory status: spontaneous breathing, nonlabored ventilation, respiratory function stable and patient connected to nasal cannula oxygen Cardiovascular status: blood pressure returned to baseline and stable Postop Assessment: no apparent nausea or vomiting Anesthetic complications: no    Last Vitals:  Vitals:   06/11/18 1111 06/11/18 2031  BP: (!) 135/97 118/77  Pulse: 94 96  Resp: 17 18  Temp:  37.4 C  SpO2: 94% 97%    Last Pain:  Vitals:   06/11/18 2031  TempSrc: Oral  PainSc:                  Klaus Casteneda

## 2018-06-11 NOTE — Transfer of Care (Signed)
Immediate Anesthesia Transfer of Care Note  Patient: Ross Carr  Procedure(s) Performed: LEG WOUND VAC CHANGE VS CLOSURE OF WOUND (Left Leg Lower)  Patient Location: PACU  Anesthesia Type:General  Level of Consciousness: awake, drowsy and patient cooperative  Airway & Oxygen Therapy: Patient Spontanous Breathing and Patient connected to nasal cannula oxygen  Post-op Assessment: Report given to RN and Post -op Vital signs reviewed and stable  Post vital signs: Reviewed and stable  Last Vitals:  Vitals Value Taken Time  BP 115/81 06/11/2018 10:12 AM  Temp    Pulse 100 06/11/2018 10:14 AM  Resp 17 06/11/2018 10:14 AM  SpO2 93 % 06/11/2018 10:14 AM  Vitals shown include unvalidated device data.  Last Pain:  Vitals:   06/11/18 0528  TempSrc:   PainSc: 8       Patients Stated Pain Goal: 3 (06/09/18 0830)  Complications: No apparent anesthesia complications

## 2018-06-11 NOTE — Progress Notes (Addendum)
Patient ID: Ross Carr, male   DOB: July 22, 1956, 62 y.o.   MRN: [id number]     Subjective:  Patient reports pain as mild to moderate.  Patient in the bed and in no acute distress.  States that the bandage is to tight  Objective:   VITALS:   Vitals:   06/11/18 1040 06/11/18 1055 06/11/18 1100 06/11/18 1111  BP: (!) 141/87 130/88 121/74 (!) 135/97  Pulse: 100 98 99 94  Resp: 17 19 18 17   Temp:  (!) 97.5 F (36.4 C)    TempSrc:      SpO2: 96% 94% 92% 94%  Weight:      Height:        ABD soft Sensation intact distally Dorsiflexion/Plantar flexion intact Incision: dressing C/D/I and no drainage ACE wrap removed and re-wrapped   Lab Results  Component Value Date   WBC 15.4 (H) 06/08/2018   HGB 12.9 (L) 06/08/2018   HCT 38.0 (L) 06/08/2018   MCV 90.4 06/08/2018   PLT 290 06/08/2018   BMET    Component Value Date/Time   NA 133 (L) 06/11/2018 0559   K 3.3 (L) 06/11/2018 0559   CL 96 (L) 06/11/2018 0559   CO2 29 06/11/2018 0559   GLUCOSE 115 (H) 06/11/2018 0559   BUN 8 06/11/2018 0559   CREATININE 0.91 06/11/2018 0559   CALCIUM 8.2 (L) 06/11/2018 0559   GFRNONAA >60 06/11/2018 0559   GFRAA >60 06/11/2018 0559     Assessment/Plan: Day of Surgery   Principal Problem:   Open fracture of left tibia and fibula, type I or II, initial encounter Active Problems:   Compartment syndrome of left lower extremity (HCC)   Advance diet Up with therapy Plan for discharge tomorrow NWB left lower ext    Nelda Severe 06/11/2018, 5:01 PM  Discussed and agree with above.   Teryl Lucy, MD Cell [phone]

## 2018-06-11 NOTE — Progress Notes (Signed)
Orthopedic Tech Progress Note Patient Details:  Ross Carr Apr 12, 1956 [id number]  Ortho Devices Type of Ortho Device: Prafo boot/shoe Ortho Device/Splint Location: lle Ortho Device/Splint Interventions: Application   Post Interventions Patient Tolerated: Well Instructions Provided: Care of device   Nikki Dom 06/11/2018, 2:46 PM

## 2018-06-11 NOTE — Plan of Care (Signed)
  Problem: Education: Goal: Knowledge of General Education information will improve Description Including pain rating scale, medication(s)/side effects and non-pharmacologic comfort measures Outcome: Progressing   Problem: Clinical Measurements: Goal: Will remain free from infection Outcome: Progressing Note:  Pt receiving IV antibiotics.    Problem: Nutrition: Goal: Adequate nutrition will be maintained Outcome: Progressing   Problem: Coping: Goal: Level of anxiety will decrease Outcome: Progressing   Problem: Elimination: Goal: Will not experience complications related to urinary retention Outcome: Progressing   Problem: Pain Managment: Goal: General experience of comfort will improve Outcome: Progressing   Problem: Safety: Goal: Ability to remain free from injury will improve Outcome: Progressing Note:  Pt remains free of injury. Bed locked and in lowest position. Call bell and urinal at bedside within reach.

## 2018-06-12 ENCOUNTER — Encounter (HOSPITAL_COMMUNITY): Payer: Self-pay | Admitting: Orthopedic Surgery

## 2018-06-12 LAB — CBC
HEMATOCRIT: 26.4 % — AB (ref 39.0–52.0)
Hemoglobin: 8.4 g/dL — ABNORMAL LOW (ref 13.0–17.0)
MCH: 29.3 pg (ref 26.0–34.0)
MCHC: 31.8 g/dL (ref 30.0–36.0)
MCV: 92 fL (ref 80.0–100.0)
NRBC: 0 % (ref 0.0–0.2)
Platelets: 230 10*3/uL (ref 150–400)
RBC: 2.87 MIL/uL — AB (ref 4.22–5.81)
RDW: 11.9 % (ref 11.5–15.5)
WBC: 12.1 10*3/uL — ABNORMAL HIGH (ref 4.0–10.5)

## 2018-06-12 LAB — BASIC METABOLIC PANEL
ANION GAP: 10 (ref 5–15)
BUN: 17 mg/dL (ref 6–20)
CHLORIDE: 101 mmol/L (ref 98–111)
CO2: 25 mmol/L (ref 22–32)
Calcium: 8.5 mg/dL — ABNORMAL LOW (ref 8.9–10.3)
Creatinine, Ser: 1.06 mg/dL (ref 0.61–1.24)
GFR calc Af Amer: >60 mL/min (ref >60)
GLUCOSE: 139 mg/dL — AB (ref 70–99)
Potassium: 4 mmol/L (ref 3.5–5.1)
Sodium: 136 mmol/L (ref 135–145)
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
## 2018-06-12 NOTE — Plan of Care (Signed)
  Problem: Education: Goal: Knowledge of General Education information will improve Description Including pain rating scale, medication(s)/side effects and non-pharmacologic comfort measures Outcome: Progressing   Problem: Clinical Measurements: Goal: Ability to maintain clinical measurements within normal limits will improve Outcome: Progressing Goal: Will remain free from infection Outcome: Progressing Note:  Pt receiving IV antibiotics.    Problem: Activity: Goal: Risk for activity intolerance will decrease Outcome: Progressing   Problem: Nutrition: Goal: Adequate nutrition will be maintained Outcome: Progressing   Problem: Elimination: Goal: Will not experience complications related to bowel motility Outcome: Progressing Note:  Pt offered PRN laxative; pt declined at the time. Will continue to monitor. Goal: Will not experience complications related to urinary retention Outcome: Progressing   Problem: Safety: Goal: Ability to remain free from injury will improve Outcome: Progressing Note:  Pt remains free of injury. Bed locked and in lowest position. Call bell within reach. Urinal at bedside.    Problem: Skin Integrity: Goal: Risk for impaired skin integrity will decrease Outcome: Progressing

## 2018-06-12 NOTE — Discharge Summary (Addendum)
Physician Discharge Summary  Patient ID: Ross Carr MRN: [id number] DOB/AGE: 04/26/1956 62 y.o.  Admit date: 06/08/2018 Discharge date: 06/13/2018  Admission Diagnoses:  Open fracture of left tibia and fibula, type I or II, initial encounter  Discharge Diagnoses:  Principal Problem:   Open fracture of left tibia and fibula, type I or II, initial encounter Active Problems:   Compartment syndrome of left lower extremity (HCC)   Past Medical History:  Diagnosis Date  . Compartment syndrome of left lower extremity (HCC) 06/08/2018  . Open fracture of left tibia and fibula, type I or II, initial encounter 06/08/2018    Surgeries: Procedure(s): LEG WOUND VAC CHANGE VS CLOSURE OF WOUND on 06/11/2018   Consultants (if any): Treatment Team:  Teryl Lucy, MD Myrene Galas, MD  Discharged Condition: Improved  Hospital Course: Ross Carr is an 62 y.o. male who was admitted 06/08/2018 with a diagnosis of Open fracture of left tibia and fibula, type I or II, initial encounter and went to the operating room on 06/11/2018 and underwent the above named procedures.    He was given perioperative antibiotics:  Anti-infectives (From admission, onward)   Start     Dose/Rate Route Frequency Ordered Stop   06/09/18 0600  ceFAZolin (ANCEF) IVPB 2g/100 mL premix     2 g 200 mL/hr over 30 Minutes Intravenous On call to O.R. 06/09/18 0223 06/10/18 0559   06/09/18 0400  ceFAZolin (ANCEF) IVPB 2g/100 mL premix     2 g 200 mL/hr over 30 Minutes Intravenous Every 6 hours 06/09/18 0309 06/11/18 2211   06/08/18 1915  ceFAZolin (ANCEF) IVPB 1 g/50 mL premix  Status:  Discontinued     1 g 100 mL/hr over 30 Minutes Intravenous  Once 06/08/18 1908 06/08/18 1920   06/08/18 0000  cephALEXin (KEFLEX) 500 MG capsule     500 mg Oral 4 times daily 06/08/18 2220      .  He was given sequential compression devices, early ambulation, and lovenox for DVT prophylaxis.  He benefited maximally from  the hospital stay and there were no complications.    Recent vital signs:  Vitals:   06/13/18 0504 06/13/18 1035  BP: 132/89 135/84  Pulse: 75 89  Resp: 17 18  Temp: 97.9 F (36.6 C) 98 F (36.7 C)  SpO2: 100% 95%    Recent laboratory studies:  Lab Results  Component Value Date   HGB 8.4 (L) 06/12/2018   HGB 12.9 (L) 06/08/2018   HGB 13.3 06/08/2018   Lab Results  Component Value Date   WBC 12.1 (H) 06/12/2018   PLT 230 06/12/2018   Lab Results  Component Value Date   INR 1.07 06/09/2018   Lab Results  Component Value Date   NA 136 06/12/2018   K 4.0 06/12/2018   CL 101 06/12/2018   CO2 25 06/12/2018   BUN 17 06/12/2018   CREATININE 1.06 06/12/2018   GLUCOSE 139 (H) 06/12/2018    Discharge Medications:   Allergies as of 06/13/2018   No Known Allergies     Medication List    STOP taking these medications   MELOXICAM PO   naproxen sodium 220 MG tablet Commonly known as:  ALEVE     TAKE these medications   aspirin EC 325 MG tablet Take 1 tablet (325 mg total) by mouth 2 (two) times daily.   cephALEXin 500 MG capsule Commonly known as:  KEFLEX Take 1 capsule (500 mg total) by mouth 4 (four) times daily.  diphenhydrAMINE 25 MG tablet Commonly known as:  BENADRYL Take 25 mg by mouth daily as needed (hives (reaction to unknown source)).   hydrochlorothiazide 25 MG tablet Commonly known as:  HYDRODIURIL Take 25 mg by mouth daily.   ondansetron 4 MG tablet Commonly known as:  ZOFRAN Take 1 tablet (4 mg total) by mouth every 8 (eight) hours as needed for nausea or vomiting.   oxyCODONE 5 MG immediate release tablet Commonly known as:  Oxy IR/ROXICODONE Take 1 tablet (5 mg total) by mouth every 4 (four) hours as needed for severe pain.   sennosides-docusate sodium 8.6-50 MG tablet Commonly known as:  SENOKOT-S Take 2 tablets by mouth daily.            Durable Medical Equipment  (From admission, onward)         Start     Ordered    06/12/18 1308  For home use only DME Walker rolling  Once    Question:  Patient needs a walker to treat with the following condition  Answer:  Open fracture of left tibia and fibula   06/12/18 1319          Diagnostic Studies: Dg Tibia/fibula Left  Result Date: 06/09/2018 CLINICAL DATA:  Left tibial IM nail. EXAM: LEFT TIBIA AND FIBULA - 2 VIEW; DG C-ARM 61-120 MIN COMPARISON:  Preoperative radiograph yesterday. FINDINGS: Eight fluoroscopic spot views of the tibia and fibula in frontal and lateral projections demonstrate intramedullary nail with proximal and distal locking screw fixation traversing distal tibial shaft fracture. Fracture is in improved alignment compared to preoperative imaging. Distal fibular fracture is also noted, not significantly changed in alignment. Total fluoroscopy time 133.4 seconds. IMPRESSION: Procedural fluoroscopy post intramedullary nail with screw fixation traversing distal tibial fracture. Electronically Signed   By: Narda Rutherford M.D.   On: 06/09/2018 02:19   Dg Tibia/fibula Left  Result Date: 06/08/2018 CLINICAL DATA:  Tractor injury EXAM: LEFT TIBIA AND FIBULA - 2 VIEW COMPARISON:  None. FINDINGS: Transverse fractures are noted through the distal tibial and fibular shafts. Minimal displacement. No subluxation or dislocation. IMPRESSION: Minimally displaced transverse fracture through the distal left tibial and fibular shafts. Electronically Signed   By: Charlett Nose M.D.   On: 06/08/2018 20:17   Dg Ankle Complete Left  Result Date: 06/08/2018 CLINICAL DATA:  Tractor rolled.  Injury. EXAM: LEFT ANKLE COMPLETE - 3+ VIEW COMPARISON:  None. FINDINGS: There is no evidence of fracture, dislocation, or joint effusion. There is no evidence of arthropathy or other focal bone abnormality. Soft tissues are unremarkable. IMPRESSION: Negative. Electronically Signed   By: Charlett Nose M.D.   On: 06/08/2018 20:17   Dg Tibia/fibula Left Port  Result Date:  06/09/2018 CLINICAL DATA:  Open fracture of left tibia and fibula, type I or II, initial encounter. Postop. EXAM: PORTABLE LEFT TIBIA AND FIBULA - 2 VIEW COMPARISON:  Preoperative and intraoperative radiographs. FINDINGS: Intramedullary nail with proximal and distal locking screws traverse mid-distal tibial shaft fracture. The fracture is in improved alignment compared to preoperative imaging. Transverse distal fibular fracture is not significantly changed in alignment. Recent postsurgical change includes air and edema in the soft tissues with wound VAC in place. Small amount of air in the knee joint. IMPRESSION: Intramedullary nail with distal locking screw fixation traversing tibial shaft fracture, in improved alignment compared to preoperative imaging. Distal fibular fracture grossly unchanged in alignment. No immediate postoperative complication. Electronically Signed   By: Narda Rutherford M.D.   On: 06/09/2018  02:21   Dg C-arm 1-60 Min  Result Date: 06/09/2018 CLINICAL DATA:  Left tibial IM nail. EXAM: LEFT TIBIA AND FIBULA - 2 VIEW; DG C-ARM 61-120 MIN COMPARISON:  Preoperative radiograph yesterday. FINDINGS: Eight fluoroscopic spot views of the tibia and fibula in frontal and lateral projections demonstrate intramedullary nail with proximal and distal locking screw fixation traversing distal tibial shaft fracture. Fracture is in improved alignment compared to preoperative imaging. Distal fibular fracture is also noted, not significantly changed in alignment. Total fluoroscopy time 133.4 seconds. IMPRESSION: Procedural fluoroscopy post intramedullary nail with screw fixation traversing distal tibial fracture. Electronically Signed   By: Narda Rutherford M.D.   On: 06/09/2018 02:19    Disposition: Discharge disposition: 01-Home or Self Care         Follow-up Information    Teryl Lucy, MD. Schedule an appointment as soon as possible for a visit in 1 week.   Specialty:  Orthopedic  Surgery Contact information: [address] [place] [address] [phone]            Signed: Eulas Post 06/13/2018, 2:31 PM
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
## 2018-06-13 NOTE — Plan of Care (Signed)

## 2018-06-13 NOTE — Plan of Care (Signed)
  Problem: Education: Goal: Knowledge of General Education information will improve Description Including pain rating scale, medication(s)/side effects and non-pharmacologic comfort measures Outcome: Progressing   Problem: Health Behavior/Discharge Planning: Goal: Ability to manage health-related needs will improve Outcome: Progressing   Problem: Clinical Measurements: Goal: Ability to maintain clinical measurements within normal limits will improve Outcome: Progressing Goal: Will remain free from infection Outcome: Progressing   Problem: Activity: Goal: Risk for activity intolerance will decrease Outcome: Progressing   Problem: Nutrition: Goal: Adequate nutrition will be maintained Outcome: Progressing   Problem: Elimination: Goal: Will not experience complications related to urinary retention Outcome: Progressing   Problem: Safety: Goal: Ability to remain free from injury will improve Outcome: Progressing   Problem: Skin Integrity: Goal: Risk for impaired skin integrity will decrease Outcome: Progressing

## 2018-06-13 NOTE — Progress Notes (Signed)
Patient ID: Christifer Chapdelaine, male   DOB: 01/02/1956, 62 y.o.   MRN: [id number]     Subjective:  Patient reports pain as mild.  Patient in bed and having more bowel issues than leg issues  Objective:   VITALS:   Vitals:   06/12/18 1139 06/12/18 1753 06/12/18 2100 06/13/18 0504  BP: (!) 145/95 (!) 155/80 (!) 154/90 132/89  Pulse: 82 99 89 75  Resp: 20 19 19 17   Temp: 98.2 F (36.8 C) 98.9 F (37.2 C) 98.4 F (36.9 C) 97.9 F (36.6 C)  TempSrc: Oral Oral Oral Oral  SpO2: 97% 96% 99% 100%  Weight:      Height:        ABD soft Sensation intact distally Dorsiflexion/Plantar flexion intact Incision: dressing C/D/I and no drainage   Lab Results  Component Value Date   WBC 12.1 (H) 06/12/2018   HGB 8.4 (L) 06/12/2018   HCT 26.4 (L) 06/12/2018   MCV 92.0 06/12/2018   PLT 230 06/12/2018   BMET    Component Value Date/Time   NA 136 06/12/2018 0205   K 4.0 06/12/2018 0205   CL 101 06/12/2018 0205   CO2 25 06/12/2018 0205   GLUCOSE 139 (H) 06/12/2018 0205   BUN 17 06/12/2018 0205   CREATININE 1.06 06/12/2018 0205   CALCIUM 8.5 (L) 06/12/2018 0205   GFRNONAA >60 06/12/2018 0205   GFRAA >60 06/12/2018 0205     Assessment/Plan: 2 Days Post-Op   Principal Problem:   Open fracture of left tibia and fibula, type I or II, initial encounter Active Problems:   Compartment syndrome of left lower extremity (HCC)   Advance diet Up with therapy Continue bowel regimen  NWB left lower ext Follow up with Dr Dion Saucier in one week Plan for DC Home     Nelda Severe 06/13/2018, 6:57 AM   Teryl Lucy, MD Cell [phone]

## 2018-06-13 NOTE — Progress Notes (Signed)
Patients pain level is a 2 at this time, waiting for his wife to arrive and take him home, patient vitals WNL.

## 2019-09-16 IMAGING — CR DG ANKLE COMPLETE 3+V*L*
3 series · 3 of 3 positions shown · non-contrast
Comparison: None.

CLINICAL DATA: Tractor rolled.  Injury.

EXAM:
LEFT ANKLE COMPLETE - 3+ VIEW

[ankle ap]
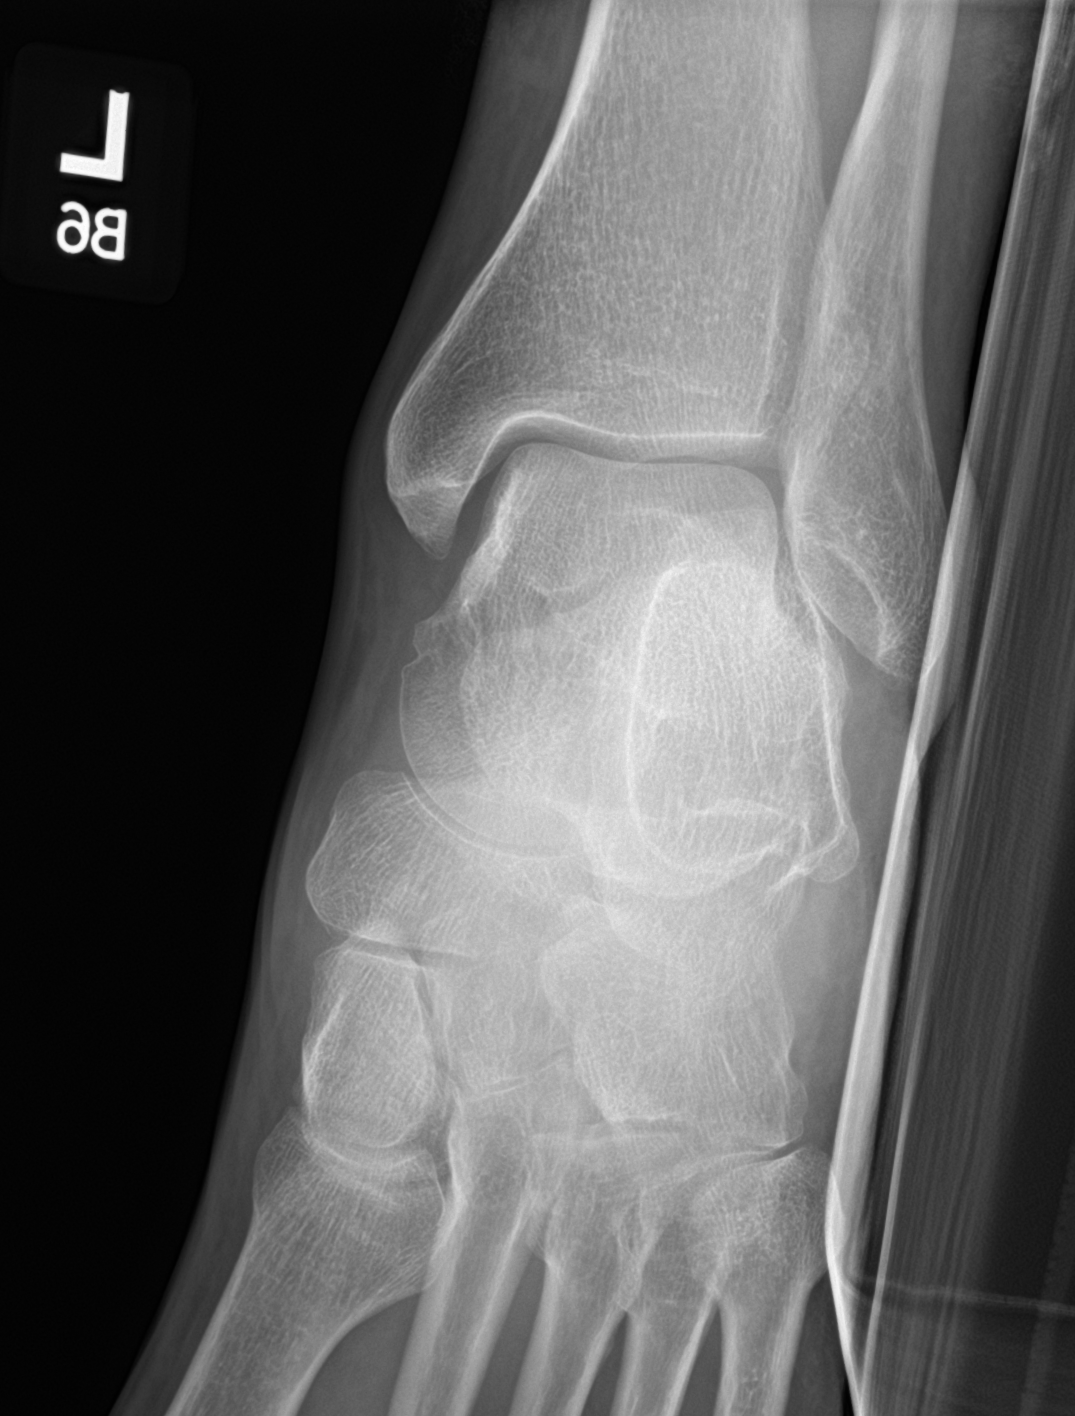

[ankle obl]
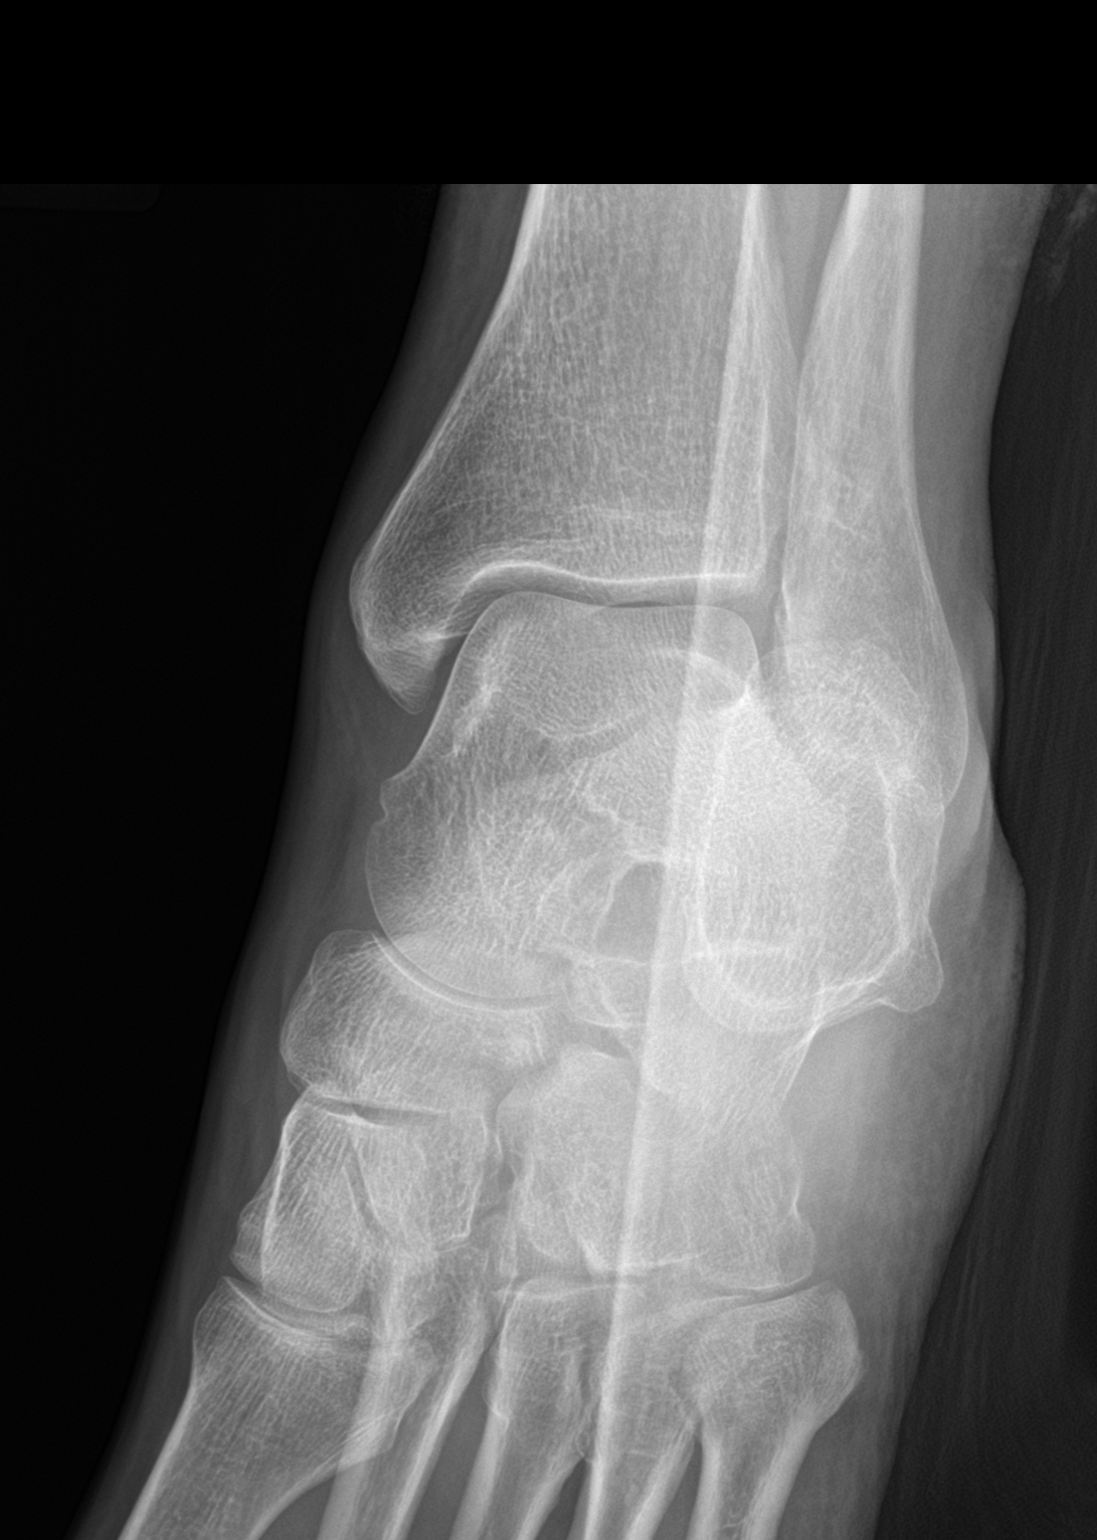

[ankle lat]
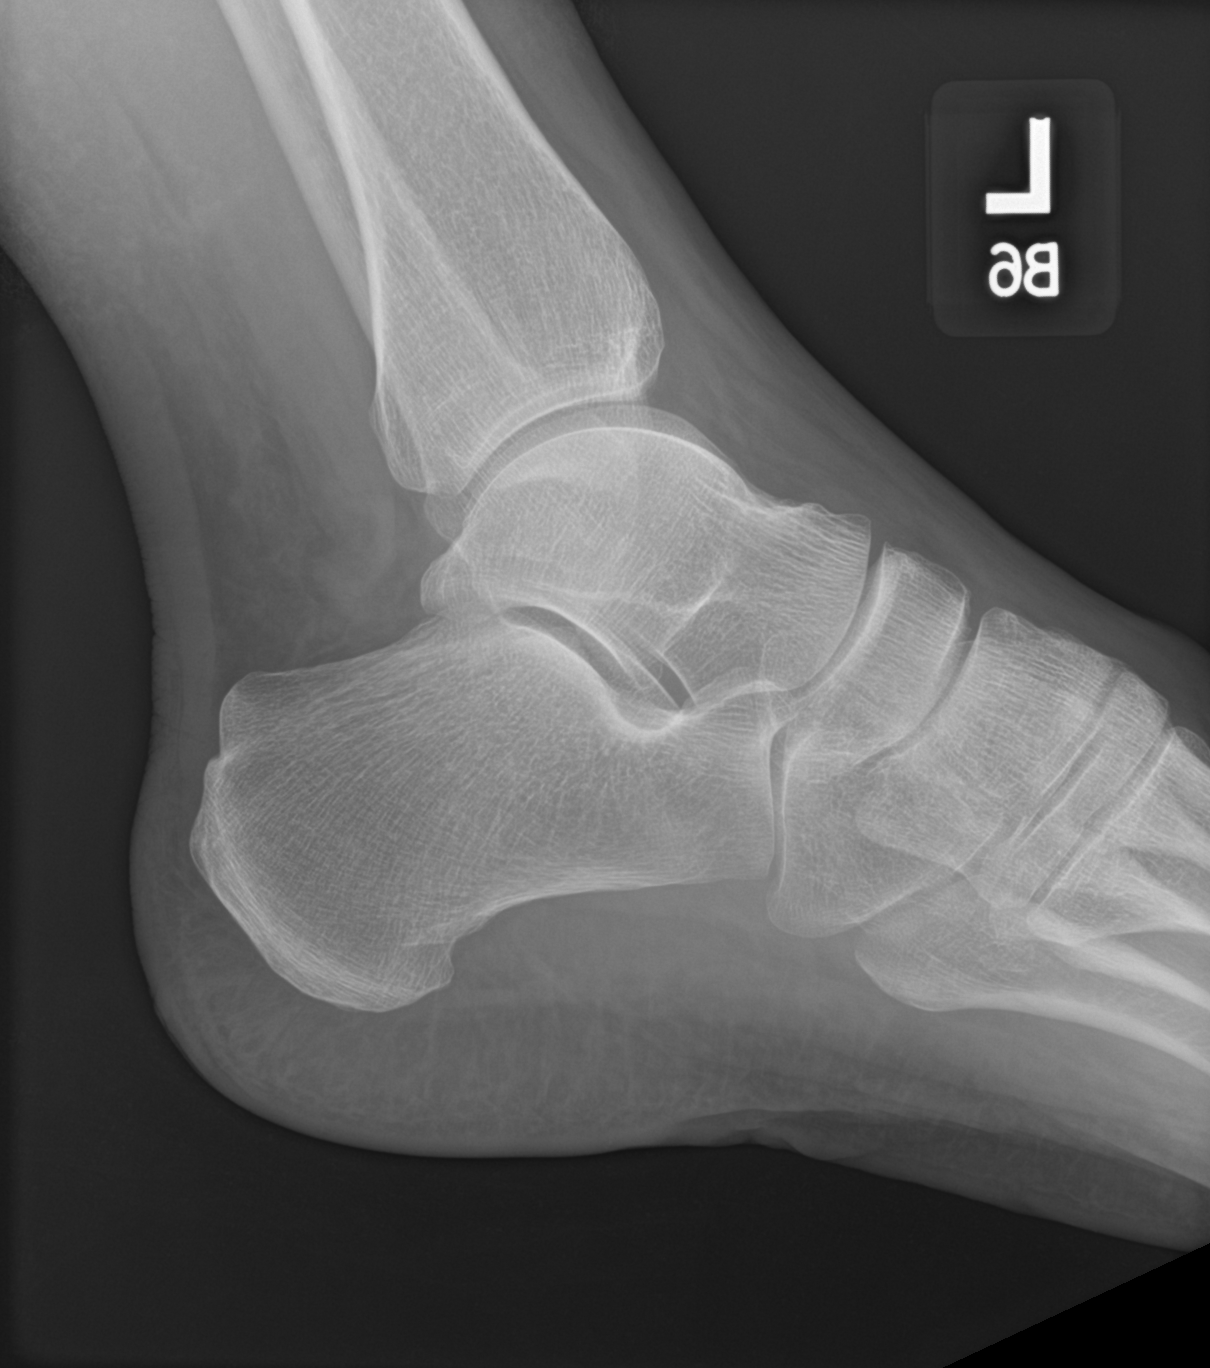

[3 of 3 positions shown; findings below may reference images not displayed]

FINDINGS: There is no evidence of fracture, dislocation, or joint effusion.
There is no evidence of arthropathy or other focal bone abnormality.
Soft tissues are unremarkable.
IMPRESSION: Negative.

## 2019-09-16 IMAGING — CR DG TIBIA/FIBULA 2V*L*
5 series · 5 of 5 positions shown · non-contrast
Comparison: None.

CLINICAL DATA: Tractor injury

EXAM:
LEFT TIBIA AND FIBULA - 2 VIEW

[tibia ap (1 of 3)]
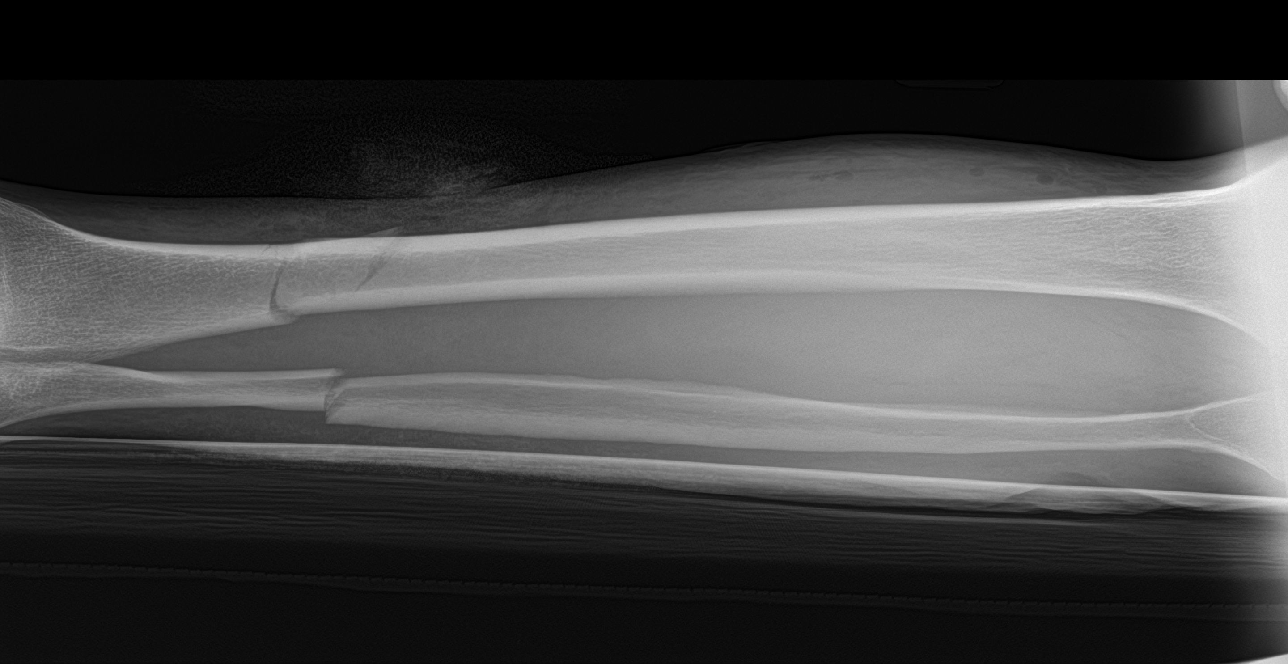

[tibia ap (2 of 3)]
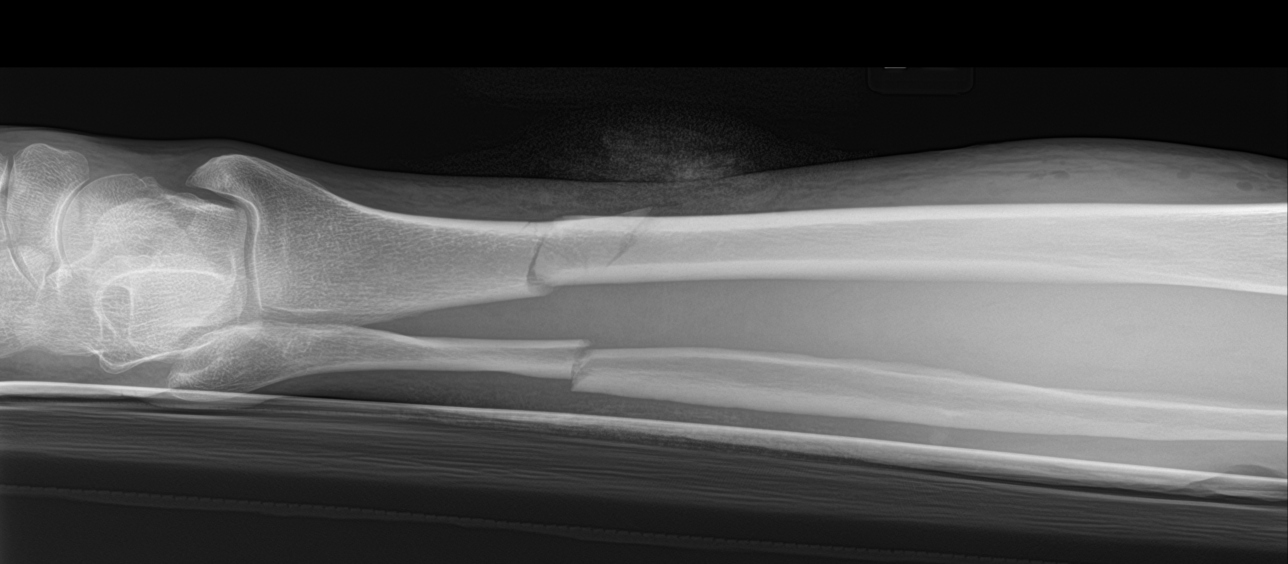

[tibia lat (1 of 2)]
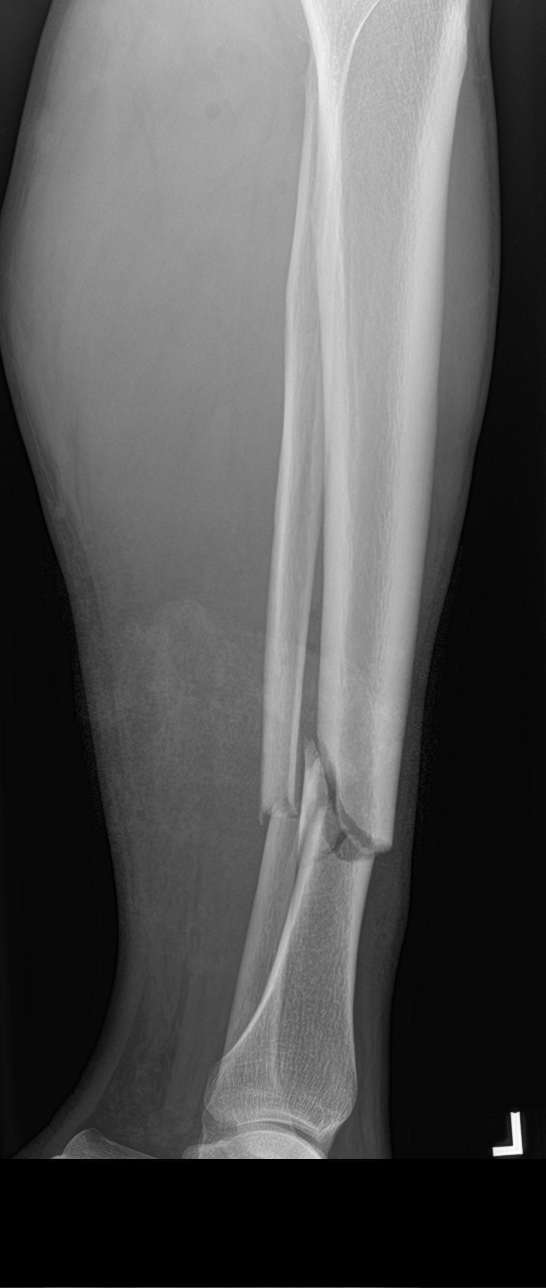

[tibia lat (2 of 2)]
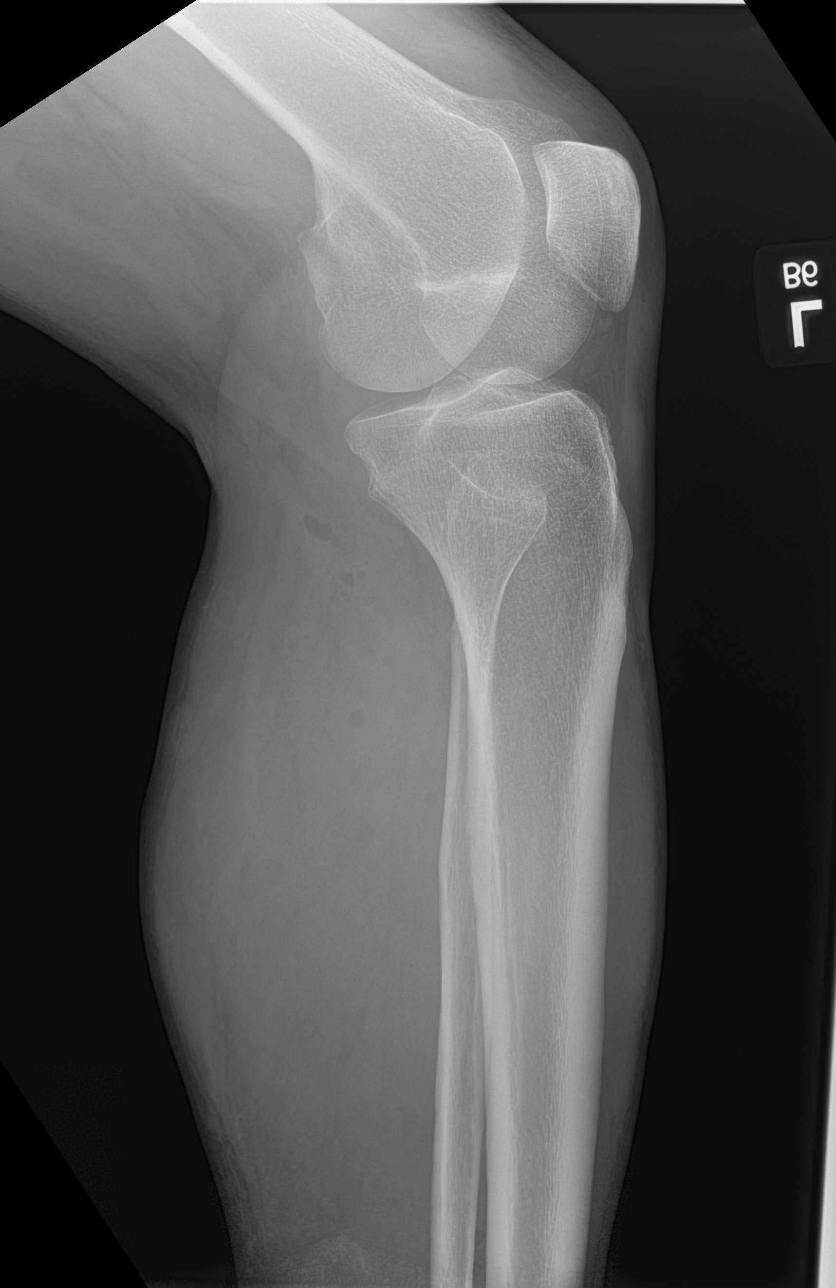

[tibia ap (3 of 3)]
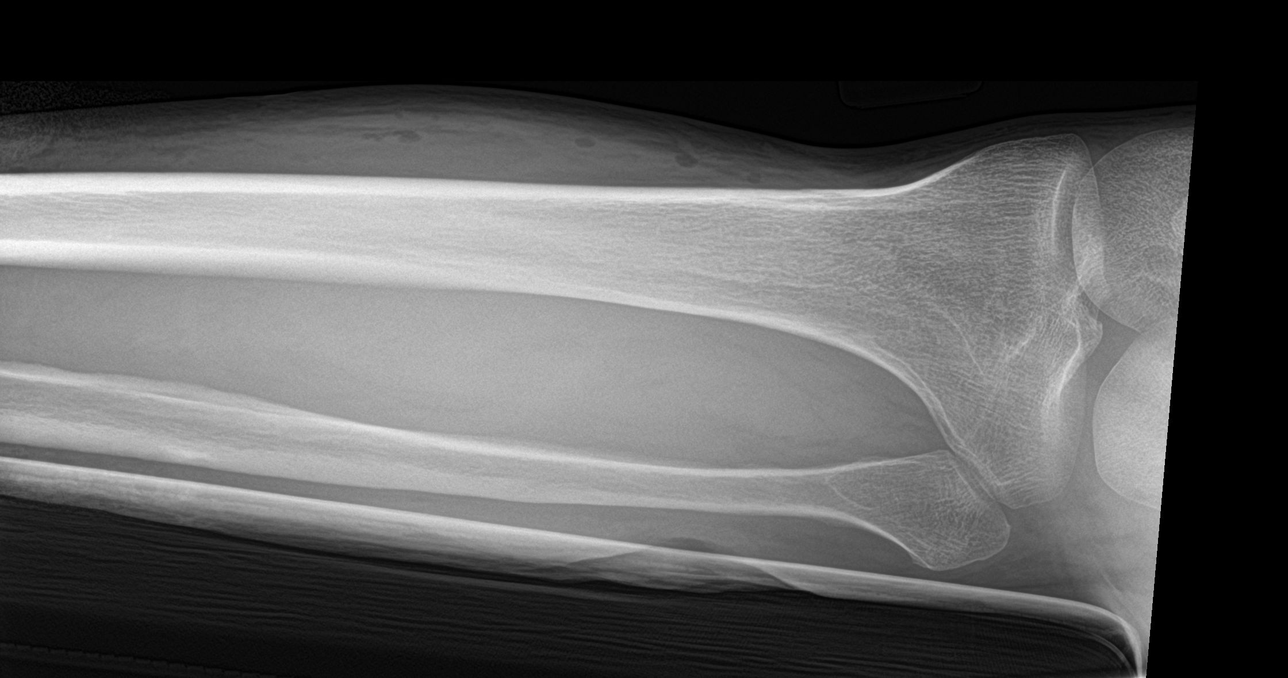

[5 of 5 positions shown; findings below may reference images not displayed]

FINDINGS: Transverse fractures are noted through the distal tibial and fibular
shafts. Minimal displacement. No subluxation or dislocation.
IMPRESSION: Minimally displaced transverse fracture through the distal left
tibial and fibular shafts.
